# Patient Record
Sex: Male | Born: 1980 | Race: Black or African American | Hispanic: No | Marital: Single | Smoking: Current some day smoker
Health system: Southern US, Community
[De-identification: ages and names within clinical notes are randomized; demographics above are authoritative.]

## PROBLEM LIST (undated history)

## (undated) DIAGNOSIS — M109 Gout, unspecified: Secondary | ICD-10-CM

## (undated) HISTORY — PX: HERNIA REPAIR: SHX51

---
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
## 2001-04-05 ENCOUNTER — Encounter: Payer: Self-pay | Admitting: Emergency Medicine

## 2001-04-05 ENCOUNTER — Emergency Department (HOSPITAL_COMMUNITY): Admission: EM | Admit: 2001-04-05 | Discharge: 2001-04-05 | Payer: Self-pay | Admitting: Emergency Medicine

## 2001-10-22 ENCOUNTER — Emergency Department (HOSPITAL_COMMUNITY): Admission: EM | Admit: 2001-10-22 | Discharge: 2001-10-22 | Payer: Self-pay

## 2002-06-21 ENCOUNTER — Emergency Department (HOSPITAL_COMMUNITY): Admission: EM | Admit: 2002-06-21 | Discharge: 2002-06-21 | Payer: Self-pay | Admitting: Emergency Medicine

## 2004-12-25 ENCOUNTER — Emergency Department (HOSPITAL_COMMUNITY): Admission: EM | Admit: 2004-12-25 | Discharge: 2004-12-25 | Payer: Self-pay | Admitting: Emergency Medicine

## 2004-12-28 ENCOUNTER — Emergency Department (HOSPITAL_COMMUNITY): Admission: EM | Admit: 2004-12-28 | Discharge: 2004-12-28 | Payer: Self-pay | Admitting: Emergency Medicine

## 2009-07-21 ENCOUNTER — Emergency Department (HOSPITAL_COMMUNITY): Admission: EM | Admit: 2009-07-21 | Discharge: 2009-07-21 | Payer: Self-pay | Admitting: Emergency Medicine

## 2010-01-11 ENCOUNTER — Emergency Department (HOSPITAL_COMMUNITY): Admission: EM | Admit: 2010-01-11 | Discharge: 2010-01-11 | Payer: Self-pay | Admitting: Emergency Medicine

## 2010-03-13 ENCOUNTER — Emergency Department (HOSPITAL_COMMUNITY)
Admission: EM | Admit: 2010-03-13 | Discharge: 2010-03-14 | Payer: Self-pay | Source: Home / Self Care | Admitting: Emergency Medicine

## 2010-04-05 ENCOUNTER — Emergency Department (HOSPITAL_COMMUNITY)
Admission: EM | Admit: 2010-04-05 | Discharge: 2010-04-05 | Payer: Self-pay | Source: Home / Self Care | Admitting: Emergency Medicine

## 2011-10-27 ENCOUNTER — Encounter (HOSPITAL_COMMUNITY): Payer: Self-pay | Admitting: Emergency Medicine

## 2011-10-27 ENCOUNTER — Emergency Department (HOSPITAL_COMMUNITY)
Admission: EM | Admit: 2011-10-27 | Discharge: 2011-10-27 | Disposition: A | Discharge status: Home / Self Care | Payer: Self-pay | Attending: Emergency Medicine | Admitting: Emergency Medicine

## 2011-10-27 DIAGNOSIS — F17200 Nicotine dependence, unspecified, uncomplicated: Secondary | ICD-10-CM | POA: Insufficient documentation

## 2011-10-27 DIAGNOSIS — D367 Benign neoplasm of other specified sites: Secondary | ICD-10-CM

## 2011-10-27 DIAGNOSIS — D2360 Other benign neoplasm of skin of unspecified upper limb, including shoulder: Secondary | ICD-10-CM | POA: Insufficient documentation

## 2011-10-27 NOTE — ED Provider Notes (Signed)
History     CSN: [id number]  Arrival date & time 10/27/11  1032   First MD Initiated Contact with Patient 10/27/11 1059      Chief Complaint  Patient presents with  . Abscess    (Consider location/radiation/quality/duration/timing/severity/associated sxs/prior treatment) HPI Comments: Pt presents with "knot" under skin of right forearm. This has been present for 3 months, he states it started small and grew gradually, and it has remained the same size for the past month. Denies tenderness, redness, swelling, numbness or tingling, weakness, fever, chills.  States the area does not cause him any discomfort, his boss today asked him to have it checked.    Patient is a 31 y.o. male presenting with abscess. The history is provided by the patient.  Abscess  Pertinent negatives include no fever.    History reviewed. No pertinent past medical history.  History reviewed. No pertinent past surgical history.  No family history on file.  History  Substance Use Topics  . Smoking status: Current Some Day Smoker  . Smokeless tobacco: Not on file  . Alcohol Use: Yes     occassionally      Review of Systems  Constitutional: Negative for fever and chills.  Skin: Negative for color change and rash.  Neurological: Negative for weakness and numbness.    Allergies  Review of patient's allergies indicates no known allergies.  Home Medications  No current outpatient prescriptions on file.  BP 142/96  Pulse 73  Temp 98.5 F (36.9 C) (Oral)  Resp 20  SpO2 97%  Physical Exam  Nursing note and vitals reviewed. Constitutional: He appears well-developed and well-nourished. No distress.  HENT:  Head: Normocephalic and atraumatic.  Neck: Neck supple.  Pulmonary/Chest: Effort normal.  Musculoskeletal:       Right elbow: Normal.      Right wrist: Normal.       Right forearm: He exhibits no tenderness, no bony tenderness, no edema and no laceration.       Right arm, strength 5/5,  sensation intact, distal pulses intact, capillary refill< 2 seconds in all digits.    Neurological: He is alert.  Skin: He is not diaphoretic.          Mobile nodule under skin, overlying well-healed tattoo (reported 30 years old)    ED Course  Procedures (including critical care time)  Labs Reviewed - No data to display No results found.   1. Dermoid cyst of arm       MDM  Patient with what appears to be a simple cyst of right forearm.  No evidence of infection.  This has been present 3 months without causing any concerns or problems.  Pt d/c home with dermatology and PCP follow up.  Discussed likely diagnosis with patient.  Pt given return precautions.  Pt verbalizes understanding and agrees with plan.           [place], [place] 10/27/11 1214

## 2011-10-27 NOTE — ED Notes (Signed)
Pt states "I've been here for this before, it's just strange that it keeps coming back in the same place, they opened it once here before but I've opened it since then"

## 2011-10-27 NOTE — ED Notes (Signed)
Pt presenting to ed with c/o abscess to right forearm x 3 months pt states intermittent fever within the 3 months pt denies fever and chills at this time

## 2011-10-29 NOTE — ED Provider Notes (Signed)
Medical screening examination/treatment/procedure(s) were performed by non-physician practitioner and as supervising physician I was immediately available for consultation/collaboration.  Temitope Griffing, MD 10/29/11 1542 

## 2013-01-13 DIAGNOSIS — K859 Acute pancreatitis without necrosis or infection, unspecified: Secondary | ICD-10-CM | POA: Insufficient documentation

## 2013-01-13 DIAGNOSIS — F1010 Alcohol abuse, uncomplicated: Secondary | ICD-10-CM | POA: Insufficient documentation

## 2013-01-13 DIAGNOSIS — F17200 Nicotine dependence, unspecified, uncomplicated: Secondary | ICD-10-CM | POA: Insufficient documentation

## 2013-01-13 NOTE — ED Notes (Signed)
Pt states LUQ abdominal pain, N/v x 5, and back pain. Abdominal pain described as cramping. Back pain as spasms.

## 2013-01-14 ENCOUNTER — Emergency Department (HOSPITAL_COMMUNITY)
Admission: EM | Admit: 2013-01-14 | Discharge: 2013-01-14 | Disposition: A | Discharge status: Home / Self Care | Payer: Self-pay | Attending: Emergency Medicine | Admitting: Emergency Medicine

## 2013-01-14 ENCOUNTER — Encounter (HOSPITAL_COMMUNITY): Payer: Self-pay | Admitting: Emergency Medicine

## 2013-01-14 DIAGNOSIS — K852 Alcohol induced acute pancreatitis without necrosis or infection: Secondary | ICD-10-CM

## 2013-01-14 LAB — CBC WITH DIFFERENTIAL/PLATELET
Basophils Absolute: 0 10*3/uL (ref 0.0–0.1)
Basophils Relative: 0 % (ref 0–1)
Eosinophils Absolute: 0.1 10*3/uL (ref 0.0–0.7)
Eosinophils Relative: 1 % (ref 0–5)
HCT: 42.1 % (ref 39.0–52.0)
Hemoglobin: 14.7 g/dL (ref 13.0–17.0)
Lymphocytes Relative: 27 % (ref 12–46)
Lymphs Abs: 2.6 10*3/uL (ref 0.7–4.0)
MCH: 28.4 pg (ref 26.0–34.0)
MCHC: 34.9 g/dL (ref 30.0–36.0)
MCV: 81.3 fL (ref 78.0–100.0)
Monocytes Absolute: 0.6 10*3/uL (ref 0.1–1.0)
Monocytes Relative: 7 % (ref 3–12)
Neutro Abs: 6.4 10*3/uL (ref 1.7–7.7)
Neutrophils Relative %: 66 % (ref 43–77)
Platelets: 299 10*3/uL (ref 150–400)
RBC: 5.18 MIL/uL (ref 4.22–5.81)
RDW: 13.3 % (ref 11.5–15.5)
WBC: 9.6 10*3/uL (ref 4.0–10.5)

## 2013-01-14 LAB — COMPREHENSIVE METABOLIC PANEL
ALT: 24 U/L (ref 0–53)
AST: 23 U/L (ref 0–37)
Albumin: 4.3 g/dL (ref 3.5–5.2)
BUN: 11 mg/dL (ref 6–23)
CO2: 24 mEq/L (ref 19–32)
Calcium: 9.4 mg/dL (ref 8.4–10.5)
Chloride: 103 mEq/L (ref 96–112)
Creatinine, Ser: 1.26 mg/dL (ref 0.50–1.35)
GFR calc Af Amer: 87 mL/min — ABNORMAL LOW (ref >90)
GFR calc non Af Amer: 75 mL/min — ABNORMAL LOW (ref >90)
Glucose, Bld: 120 mg/dL — ABNORMAL HIGH (ref 70–99)
Total Bilirubin: 0.3 mg/dL (ref 0.3–1.2)
Total Protein: 7.9 g/dL (ref 6.0–8.3)

## 2013-01-14 MED ORDER — PANTOPRAZOLE SODIUM 40 MG IV SOLR
INTRAVENOUS | Status: AC
  Filled 2013-01-14: qty 40

## 2013-01-14 MED ORDER — METOCLOPRAMIDE HCL 10 MG PO TABS: 10.0000 mg | ORAL_TABLET | Freq: Four times a day (QID) | ORAL | Status: DC | PRN

## 2013-01-14 MED ORDER — KETOROLAC TROMETHAMINE 30 MG/ML IJ SOLN
INTRAMUSCULAR | Status: AC
  Filled 2013-01-14: qty 1

## 2013-01-14 MED ORDER — ONDANSETRON HCL 4 MG/2ML IJ SOLN
INTRAMUSCULAR | Status: AC
  Filled 2013-01-14: qty 2

## 2013-01-14 MED ORDER — OXYCODONE-ACETAMINOPHEN 5-325 MG PO TABS: 1.0000 | ORAL_TABLET | ORAL | Status: DC | PRN

## 2013-01-14 NOTE — ED Provider Notes (Signed)
CSN: [id number]     Arrival date & time 01/13/13  2355 History   First MD Initiated Contact with Patient 01/14/13 0134     Chief Complaint  Patient presents with  . Abdominal Pain   (Consider location/radiation/quality/duration/timing/severity/associated sxs/prior Treatment) Patient is a 32 y.o. male presenting with abdominal pain. The history is provided by the patient.  Abdominal Pain He complains of epigastric pain which started about 7:30 PM. Pain does radiate to the back. He describes it as sharp, crampy feeling. He rates pain at 10/10. Pain is worse if he lays supine or on either side. There has been associated nausea and vomiting he does live a better after vomiting. He tried taking Pepto-Bismol and ibuprofen with no relief. He did drink about 5 shots of liquor about 3 hours before onset of pain. He denies having had any episodes of pain like this in the past.  History reviewed. No pertinent past medical history. No past surgical history on file. No family history on file. History  Substance Use Topics  . Smoking status: Current Some Day Smoker  . Smokeless tobacco: Not on file  . Alcohol Use: Yes     Comment: occassionally    Review of Systems  Gastrointestinal: Positive for abdominal pain.  All other systems reviewed and are negative.    Allergies  Review of patient's allergies indicates no known allergies.  Home Medications   Current Outpatient Rx  Name  Route  Sig  Dispense  Refill  . metoCLOPramide (REGLAN) 10 MG tablet   Oral   Take 1 tablet (10 mg total) by mouth every 6 (six) hours as needed (Nausea).   30 tablet   0   . oxyCODONE-acetaminophen (PERCOCET) 5-325 MG per tablet   Oral   Take 1 tablet by mouth every 4 (four) hours as needed for pain.   20 tablet   0    BP 159/107  Temp(Src) 98.4 F (36.9 C) (Oral)  Resp 18  SpO2 97% Physical Exam  Nursing note and vitals reviewed.  32 year old male, resting comfortably and in no acute distress.  Vital signs are significant for hypertension with blood pressure 159/107. Oxygen saturation is 97%, which is normal. Head is normocephalic and atraumatic. PERRLA, EOMI. Oropharynx is clear. Neck is nontender and supple without adenopathy or JVD. Back is nontender and there is no CVA tenderness. Lungs are clear without rales, wheezes, or rhonchi. Chest is nontender. Heart has regular rate and rhythm without murmur. Abdomen is soft, flat, with mild epigastric tenderness. There is no rebound or guarding. There are no masses or hepatosplenomegaly and peristalsis is hypoactive. Extremities have no cyanosis or edema, full range of motion is present. Skin is warm and dry without rash. Neurologic: Mental status is normal, cranial nerves are intact, there are no motor or sensory deficits.  ED Course  Procedures (including critical care time) Labs Review Results for orders placed during the hospital encounter of 01/14/13  CBC WITH DIFFERENTIAL      Result Value Range   WBC 9.6  4.0 - 10.5 K/uL   RBC 5.18  4.22 - 5.81 MIL/uL   Hemoglobin 14.7  13.0 - 17.0 g/dL   HCT 40.9  81.1 - 91.4 %   MCV 81.3  78.0 - 100.0 fL   MCH 28.4  26.0 - 34.0 pg   MCHC 34.9  30.0 - 36.0 g/dL   RDW 78.2  95.6 - 21.3 %   Platelets 299  150 - 400 K/uL  Neutrophils Relative % 66  43 - 77 %   Neutro Abs 6.4  1.7 - 7.7 K/uL   Lymphocytes Relative 27  12 - 46 %   Lymphs Abs 2.6  0.7 - 4.0 K/uL   Monocytes Relative 7  3 - 12 %   Monocytes Absolute 0.6  0.1 - 1.0 K/uL   Eosinophils Relative 1  0 - 5 %   Eosinophils Absolute 0.1  0.0 - 0.7 K/uL   Basophils Relative 0  0 - 1 %   Basophils Absolute 0.0  0.0 - 0.1 K/uL  COMPREHENSIVE METABOLIC PANEL      Result Value Range   Sodium 141  135 - 145 mEq/L   Potassium 3.5  3.5 - 5.1 mEq/L   Chloride 103  96 - 112 mEq/L   CO2 24  19 - 32 mEq/L   Glucose, Bld 120 (*) 70 - 99 mg/dL   BUN 11  6 - 23 mg/dL   Creatinine, Ser 1.61  0.50 - 1.35 mg/dL   Calcium 9.4  8.4 - 09.6  mg/dL   Total Protein 7.9  6.0 - 8.3 g/dL   Albumin 4.3  3.5 - 5.2 g/dL   AST 23  0 - 37 U/L   ALT 24  0 - 53 U/L   Alkaline Phosphatase 57  39 - 117 U/L   Total Bilirubin 0.3  0.3 - 1.2 mg/dL   GFR calc non Af Amer 75 (*) >90 mL/min   GFR calc Af Amer 87 (*) >90 mL/min  LIPASE, BLOOD      Result Value Range   Lipase 83 (*) 11 - 59 U/L   MDM   1. Acute alcoholic pancreatitis    Abdominal pain which seems most consistent with gastritis, possibly alcoholic gastritis. He'll be given IV fluids, IV pantoprazole, and IV ondansetron and reassessed.  He feels much better after above noted treatment. Lipase is come back significantly elevated and patient when confronted with this does admit to significantly more ethanol consumption than for what he stated initially. He is advised to abstain from alcohol. Since his symptoms are controlled well, he is felt stable for discharge and he is discharged with prescriptions for metoclopramide and oxycodone-acetaminophen and he is told to take over-the-counter omeprazole.    Dione Booze, MD 01/14/13 [phone]

## 2013-01-14 NOTE — ED Notes (Signed)
MD at Bedside.

## 2013-01-14 NOTE — ED Notes (Signed)
For previous charting see downtime forms

## 2013-10-16 ENCOUNTER — Emergency Department (HOSPITAL_COMMUNITY): Payer: Self-pay

## 2013-10-16 ENCOUNTER — Emergency Department (HOSPITAL_COMMUNITY)
Admission: EM | Admit: 2013-10-16 | Discharge: 2013-10-16 | Disposition: A | Discharge status: Home / Self Care | Payer: Self-pay | Attending: Emergency Medicine | Admitting: Emergency Medicine

## 2013-10-16 ENCOUNTER — Encounter (HOSPITAL_COMMUNITY): Payer: Self-pay | Admitting: Emergency Medicine

## 2013-10-16 DIAGNOSIS — W261XXA Contact with sword or dagger, initial encounter: Secondary | ICD-10-CM

## 2013-10-16 DIAGNOSIS — Z792 Long term (current) use of antibiotics: Secondary | ICD-10-CM | POA: Insufficient documentation

## 2013-10-16 DIAGNOSIS — Z23 Encounter for immunization: Secondary | ICD-10-CM | POA: Insufficient documentation

## 2013-10-16 DIAGNOSIS — W260XXA Contact with knife, initial encounter: Secondary | ICD-10-CM | POA: Insufficient documentation

## 2013-10-16 DIAGNOSIS — Y9389 Activity, other specified: Secondary | ICD-10-CM | POA: Insufficient documentation

## 2013-10-16 DIAGNOSIS — F17200 Nicotine dependence, unspecified, uncomplicated: Secondary | ICD-10-CM | POA: Insufficient documentation

## 2013-10-16 DIAGNOSIS — S71009A Unspecified open wound, unspecified hip, initial encounter: Secondary | ICD-10-CM | POA: Insufficient documentation

## 2013-10-16 DIAGNOSIS — S71111A Laceration without foreign body, right thigh, initial encounter: Secondary | ICD-10-CM

## 2013-10-16 DIAGNOSIS — Z7982 Long term (current) use of aspirin: Secondary | ICD-10-CM | POA: Insufficient documentation

## 2013-10-16 DIAGNOSIS — S71109A Unspecified open wound, unspecified thigh, initial encounter: Principal | ICD-10-CM | POA: Insufficient documentation

## 2013-10-16 DIAGNOSIS — Y929 Unspecified place or not applicable: Secondary | ICD-10-CM | POA: Insufficient documentation

## 2013-10-16 LAB — BASIC METABOLIC PANEL
Anion gap: 13 (ref 5–15)
BUN: 11 mg/dL (ref 6–23)
CHLORIDE: 102 meq/L (ref 96–112)
CO2: 26 mEq/L (ref 19–32)
CREATININE: 1.19 mg/dL (ref 0.50–1.35)
Calcium: 8.7 mg/dL (ref 8.4–10.5)
GFR calc non Af Amer: 80 mL/min — ABNORMAL LOW (ref >90)
GLUCOSE: 92 mg/dL (ref 70–99)
Potassium: 3.6 mEq/L — ABNORMAL LOW (ref 3.7–5.3)
Sodium: 141 mEq/L (ref 137–147)

## 2013-10-16 LAB — CBC WITH DIFFERENTIAL/PLATELET
Basophils Absolute: 0 10*3/uL (ref 0.0–0.1)
Basophils Relative: 1 % (ref 0–1)
Eosinophils Absolute: 0.2 10*3/uL (ref 0.0–0.7)
Eosinophils Relative: 3 % (ref 0–5)
HCT: 40.3 % (ref 39.0–52.0)
HEMOGLOBIN: 12.9 g/dL — AB (ref 13.0–17.0)
LYMPHS ABS: 2.5 10*3/uL (ref 0.7–4.0)
Lymphocytes Relative: 38 % (ref 12–46)
MCH: 26.6 pg (ref 26.0–34.0)
MCHC: 32 g/dL (ref 30.0–36.0)
MCV: 83.1 fL (ref 78.0–100.0)
MONOS PCT: 9 % (ref 3–12)
Monocytes Absolute: 0.6 10*3/uL (ref 0.1–1.0)
NEUTROS ABS: 3.2 10*3/uL (ref 1.7–7.7)
Neutrophils Relative %: 49 % (ref 43–77)
Platelets: 311 10*3/uL (ref 150–400)
RBC: 4.85 MIL/uL (ref 4.22–5.81)
RDW: 12.6 % (ref 11.5–15.5)
WBC: 6.5 10*3/uL (ref 4.0–10.5)

## 2013-10-16 LAB — PROTIME-INR
INR: 1.04 (ref 0.00–1.49)
PROTHROMBIN TIME: 13.6 s (ref 11.6–15.2)

## 2013-10-16 LAB — APTT: aPTT: 29 seconds (ref 24–37)

## 2013-10-16 MED ORDER — FENTANYL CITRATE 0.05 MG/ML IJ SOLN
INTRAMUSCULAR | Status: AC
  Filled 2013-10-16: qty 2

## 2013-10-16 MED ORDER — TETANUS-DIPHTH-ACELL PERTUSSIS 5-2.5-18.5 LF-MCG/0.5 IM SUSP
0.5000 mL | Freq: Once | INTRAMUSCULAR | Status: AC
  Administered 2013-10-16: 0.5 mL via INTRAMUSCULAR
  Filled 2013-10-16: qty 0.5

## 2013-10-16 MED ORDER — HYDROCODONE-ACETAMINOPHEN 5-325 MG PO TABS: 2.0000 | ORAL_TABLET | ORAL | Status: DC | PRN

## 2013-10-16 MED ORDER — SULFAMETHOXAZOLE-TRIMETHOPRIM 800-160 MG PO TABS: 1.0000 | ORAL_TABLET | Freq: Two times a day (BID) | ORAL | Status: DC

## 2013-10-16 MED ORDER — FENTANYL CITRATE 0.05 MG/ML IJ SOLN
50.0000 ug | Freq: Once | INTRAMUSCULAR | Status: AC
  Administered 2013-10-16: 50 ug via INTRAVENOUS

## 2013-10-16 MED ORDER — CEPHALEXIN 500 MG PO CAPS: 500.0000 mg | ORAL_CAPSULE | Freq: Four times a day (QID) | ORAL | Status: DC

## 2013-10-16 NOTE — ED Notes (Signed)
Pillow placed under right knee for comfort.
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
## 2013-10-16 NOTE — ED Notes (Signed)
Pt wc to car via security. Pt alert x4 dressing applied with ace wrap

## 2013-10-16 NOTE — ED Notes (Signed)
Ambulate pt with one assist. Pt denies pain with weight bearing but states he is unable to bend leg. Pt steps and drags leg.

## 2013-10-16 NOTE — Progress Notes (Signed)
Orthopedic Tech Progress Note Patient Details:  Derrick Shaffer 28-Jul-1980 [id number]  Ortho Devices Type of Ortho Device: Knee Immobilizer;Crutches Ortho Device/Splint Location: RLE Ortho Device/Splint Interventions: Ordered;Application;Adjustment   Braulio Bosch 10/16/2013, 8:26 PM

## 2013-10-16 NOTE — ED Notes (Signed)
Portable xray being completed.  

## 2013-10-16 NOTE — Discharge Instructions (Signed)
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
## 2013-12-07 ENCOUNTER — Encounter (HOSPITAL_COMMUNITY): Payer: Self-pay | Admitting: Emergency Medicine

## 2013-12-07 ENCOUNTER — Emergency Department (HOSPITAL_COMMUNITY)
Admission: EM | Admit: 2013-12-07 | Discharge: 2013-12-07 | Disposition: A | Discharge status: Home / Self Care | Payer: Worker's Compensation | Attending: Emergency Medicine | Admitting: Emergency Medicine

## 2013-12-07 DIAGNOSIS — Y9389 Activity, other specified: Secondary | ICD-10-CM | POA: Insufficient documentation

## 2013-12-07 DIAGNOSIS — Z792 Long term (current) use of antibiotics: Secondary | ICD-10-CM | POA: Insufficient documentation

## 2013-12-07 DIAGNOSIS — Y990 Civilian activity done for income or pay: Secondary | ICD-10-CM | POA: Insufficient documentation

## 2013-12-07 DIAGNOSIS — S39012A Strain of muscle, fascia and tendon of lower back, initial encounter: Secondary | ICD-10-CM

## 2013-12-07 DIAGNOSIS — X503XXA Overexertion from repetitive movements, initial encounter: Secondary | ICD-10-CM | POA: Insufficient documentation

## 2013-12-07 DIAGNOSIS — IMO0002 Reserved for concepts with insufficient information to code with codable children: Secondary | ICD-10-CM | POA: Insufficient documentation

## 2013-12-07 DIAGNOSIS — S335XXA Sprain of ligaments of lumbar spine, initial encounter: Secondary | ICD-10-CM | POA: Insufficient documentation

## 2013-12-07 DIAGNOSIS — F17200 Nicotine dependence, unspecified, uncomplicated: Secondary | ICD-10-CM | POA: Insufficient documentation

## 2013-12-07 DIAGNOSIS — X500XXA Overexertion from strenuous movement or load, initial encounter: Secondary | ICD-10-CM | POA: Insufficient documentation

## 2013-12-07 DIAGNOSIS — Y9289 Other specified places as the place of occurrence of the external cause: Secondary | ICD-10-CM | POA: Insufficient documentation

## 2013-12-07 MED ORDER — CYCLOBENZAPRINE HCL 10 MG PO TABS: 10.0000 mg | ORAL_TABLET | Freq: Two times a day (BID) | ORAL | Status: DC | PRN

## 2013-12-07 MED ORDER — MELOXICAM 15 MG PO TABS: 15.0000 mg | ORAL_TABLET | Freq: Every day | ORAL | Status: DC

## 2013-12-07 NOTE — ED Notes (Signed)
Pt reports that he was at work and was using a Therapist, art to life a heavy object and states he was using force to use the tool and states that he developed lower back pain. Pt reports having back pain 7-10 years ago, however states the pain then was worse than it is today. Pt is ambulatory to triage unassisted with a steady gait, denies incontinence, pt is A/O x4, in NAD, and vitals are WDL.

## 2013-12-07 NOTE — ED Provider Notes (Signed)
CSN: [id number]     Arrival date & time 12/07/13  1842 History  This chart was scribed for non-physician practitioner, Alvina Chou, PA-C,working with Orlie Dakin, MD, by Jeanell Sparrow, ED Scribe. This patient was seen in room WTR8/WTR8 and the patient's care was started at 7:30 PM.   Chief Complaint  Patient presents with  . Back Pain   The history is provided by the patient. No language interpreter was used.   HPI Comments: Derrick Shaffer is a 33 y.o. male who presents to the Emergency Department complaining of constant moderate left sided lower back pain that started today . He states that he was at work when he was using a Therapist, art to lift a heavy object when the pain started. He denies any fall. He describes the sensation as a muscle spasm. He reports that he took ibuprofen without any relief. He denies any associated numbness or tingling, or abdominal pain.   History reviewed. No pertinent past medical history. History reviewed. No pertinent past surgical history. No family history on file. History  Substance Use Topics  . Smoking status: Current Some Day Smoker  . Smokeless tobacco: Not on file  . Alcohol Use: Yes     Comment: occassionally    Review of Systems  Musculoskeletal: Positive for back pain.  All other systems reviewed and are negative.   Allergies  Review of patient's allergies indicates no known allergies.  Home Medications   Prior to Admission medications   Medication Sig Start Date End Date Taking? Authorizing Provider  Aspirin-Salicylamide-Caffeine (BC HEADACHE POWDER PO) Take 2 packets by mouth daily as needed (headaches).    Historical Provider, MD  cephALEXin (KEFLEX) 500 MG capsule Take 1 capsule (500 mg total) by mouth 4 (four) times daily. 10/16/13   Deno Etienne, MD  HYDROcodone-acetaminophen (NORCO/VICODIN) 5-325 MG per tablet Take 2 tablets by mouth every 4 (four) hours as needed for moderate pain or severe pain. 10/16/13   Deno Etienne, MD   sulfamethoxazole-trimethoprim (SEPTRA DS) 800-160 MG per tablet Take 1 tablet by mouth every 12 (twelve) hours. 10/16/13   Deno Etienne, MD   BP 121/64  Pulse 55  Temp(Src) 98.2 F (36.8 C) (Oral)  Resp 18  SpO2 99% Physical Exam  Nursing note and vitals reviewed. Constitutional: He is oriented to person, place, and time. He appears well-developed and well-nourished.  HENT:  Head: Normocephalic and atraumatic.  Neck: Neck supple. No tracheal deviation present.  Cardiovascular: Normal rate.   Pulmonary/Chest: Effort normal. No respiratory distress.  Abdominal: There is no tenderness.  Musculoskeletal: Normal range of motion.  No midline spine TTP. Left lumbar paraspinal TTP.   Neurological: He is alert and oriented to person, place, and time.  Strength and sensation in extremities intact.   Skin: Skin is warm and dry.  Psychiatric: He has a normal mood and affect. His behavior is normal.    ED Course  Procedures (including critical care time) DIAGNOSTIC STUDIES: Oxygen Saturation is 99% on RA, normal by my interpretation.    COORDINATION OF CARE: 7:34 PM- Pt advised of plan for treatment and pt agrees.  Labs Review Labs Reviewed - No data to display  Imaging Review No results found.   EKG Interpretation None      MDM   Final diagnoses:  Lumbar strain, initial encounter    8:04 PM Patient likely has muscle spasm of left lumbar paraspinal muscles. No blunt trauma or bony tenderness to indicate imaging at this time. No bladder/bowel  incontinence or saddle paresthesias. Patient will have mobic and flexeril for pain. No further evaluation needed at this time.   I personally performed the services described in this documentation, which was scribed in my presence. The recorded information has been reviewed and is accurate.     Alvina Chou, PA-C 12/07/13 2007

## 2013-12-07 NOTE — Discharge Instructions (Signed)
Take Mobic as needed for pain. Take Flexeril as needed for muscle spasm. You may take these medications together. Refer to attached documents for more information.

## 2013-12-08 NOTE — ED Provider Notes (Signed)
Medical screening examination/treatment/procedure(s) were performed by non-physician practitioner and as supervising physician I was immediately available for consultation/collaboration.   EKG Interpretation None       Orlie Dakin, MD 12/08/13 0174

## 2014-09-19 ENCOUNTER — Emergency Department (HOSPITAL_BASED_OUTPATIENT_CLINIC_OR_DEPARTMENT_OTHER)
Admission: EM | Admit: 2014-09-19 | Discharge: 2014-09-19 | Disposition: A | Discharge status: Home / Self Care | Payer: Self-pay | Attending: Emergency Medicine | Admitting: Emergency Medicine

## 2014-09-19 ENCOUNTER — Encounter (HOSPITAL_BASED_OUTPATIENT_CLINIC_OR_DEPARTMENT_OTHER): Payer: Self-pay

## 2014-09-19 DIAGNOSIS — Z720 Tobacco use: Secondary | ICD-10-CM | POA: Insufficient documentation

## 2014-09-19 DIAGNOSIS — Z202 Contact with and (suspected) exposure to infections with a predominantly sexual mode of transmission: Secondary | ICD-10-CM | POA: Insufficient documentation

## 2014-09-19 DIAGNOSIS — Z791 Long term (current) use of non-steroidal anti-inflammatories (NSAID): Secondary | ICD-10-CM | POA: Insufficient documentation

## 2014-09-19 LAB — URINALYSIS, ROUTINE W REFLEX MICROSCOPIC
BILIRUBIN URINE: NEGATIVE
Glucose, UA: NEGATIVE mg/dL
Hgb urine dipstick: NEGATIVE
KETONES UR: NEGATIVE mg/dL
Leukocytes, UA: NEGATIVE
Nitrite: NEGATIVE
PROTEIN: NEGATIVE mg/dL
Specific Gravity, Urine: 1.024 (ref 1.005–1.030)
Urobilinogen, UA: 1 mg/dL (ref 0.0–1.0)
pH: 7 (ref 5.0–8.0)

## 2014-09-19 MED ORDER — AZITHROMYCIN 250 MG PO TABS
1000.0000 mg | ORAL_TABLET | Freq: Once | ORAL | Status: AC
  Administered 2014-09-19: 1000 mg via ORAL
  Filled 2014-09-19: qty 4

## 2014-09-19 MED ORDER — CEFTRIAXONE SODIUM 250 MG IJ SOLR
250.0000 mg | Freq: Once | INTRAMUSCULAR | Status: AC
  Administered 2014-09-19: 250 mg via INTRAMUSCULAR
  Filled 2014-09-19: qty 250

## 2014-09-19 NOTE — ED Notes (Signed)
Pt reports was told by sex partner that she had gonorrhea.  States he is asymptomatic.  Reports also with ha but unsure if this is stress r/t symptoms.

## 2014-09-19 NOTE — Discharge Instructions (Signed)
Please read and follow all provided instructions.  Your diagnoses today include:  1. Exposure to STD     Tests performed today include:  Test for gonorrhea, chlamydia, HIV, and syphilis. You will be notified by telephone if you have a positive result.  Vital signs. See below for your results today.   Medications:  You were treated for chlamydia (1 gram azithromycin pills) and gonorrhea (250mg  rocephin shot).  Home care instructions:  Read educational materials contained in this packet and follow any instructions provided.   You should tell your partners about your infection and avoid having sex for one week to allow time for the medicine to work.  STD Testing:  [place]  [address], [place], phone [phone] or [phone]    Monday - Friday, call for an appointment  [place]  [address], [place], phone [phone] or [phone]   Monday - Friday, call for an appointment  Return instructions:   Please return to the Emergency Department if you experience worsening symptoms.   Please return if you have any other emergent concerns.  Additional Information:  Your vital signs today were: BP 115/68 mmHg   Pulse 63   Temp(Src) 98.2 F (36.8 C) (Oral)   Resp 18   Ht 6' (1.829 m)   Wt 230 lb (104.327 kg)   BMI 31.19 kg/m2   SpO2 99% If your blood pressure (BP) was elevated above 135/85 this visit, please have this repeated by your doctor within one month. --------------

## 2014-09-19 NOTE — ED Provider Notes (Signed)
CSN: [id number]     Arrival date & time 09/19/14  1926 History   First MD Initiated Contact with Patient 09/19/14 2038     Chief Complaint  Patient presents with  . Exposure to STD     (Consider location/radiation/quality/duration/timing/severity/associated sxs/prior Treatment) HPI Comments: Patient presents with complaint of exposure to gonorrhea. Patient states a sexual partners told him that she tested positive for this. Patient has no symptoms including dysuria, hematuria, penile discharge. He denies rashes or sores. No sore throat. No fever, nausea or vomiting.   Patient is a 34 y.o. male presenting with STD exposure. The history is provided by the patient.  Exposure to STD Pertinent negatives include no arthralgias, fever, rash or sore throat.    History reviewed. No pertinent past medical history. History reviewed. No pertinent past surgical history. No family history on file. History  Substance Use Topics  . Smoking status: Current Some Day Smoker  . Smokeless tobacco: Not on file  . Alcohol Use: Yes     Comment: occassionally    Review of Systems  Constitutional: Negative for fever.  HENT: Negative for sore throat.   Eyes: Negative for discharge.  Gastrointestinal: Negative for rectal pain.  Genitourinary: Negative for dysuria, frequency, discharge, genital sores, penile pain and testicular pain.  Musculoskeletal: Negative for arthralgias.  Skin: Negative for rash.  Hematological: Negative for adenopathy.      Allergies  Review of patient's allergies indicates no known allergies.  Home Medications   Prior to Admission medications   Medication Sig Start Date End Date Taking? Authorizing Provider  Aspirin-Salicylamide-Caffeine (BC HEADACHE POWDER PO) Take 2 packets by mouth daily as needed (headaches).    Historical Provider, MD  cyclobenzaprine (FLEXERIL) 10 MG tablet Take 1 tablet (10 mg total) by mouth 2 (two) times daily as needed for muscle spasms. 12/07/13    Kaitlyn Szekalski, PA-C  ibuprofen (ADVIL,MOTRIN) 200 MG tablet Take 800 mg by mouth every 6 (six) hours as needed for mild pain or moderate pain (back pain).    Historical Provider, MD  meloxicam (MOBIC) 15 MG tablet Take 1 tablet (15 mg total) by mouth daily. 12/07/13   Kaitlyn Szekalski, PA-C   BP 115/68 mmHg  Pulse 63  Temp(Src) 98.2 F (36.8 C) (Oral)  Resp 18  Ht 6' (1.829 m)  Wt 230 lb (104.327 kg)  BMI 31.19 kg/m2  SpO2 99%   Physical Exam  Constitutional: He appears well-developed and well-nourished.  HENT:  Head: Normocephalic and atraumatic.  Eyes: Conjunctivae are normal.  Neck: Normal range of motion. Neck supple.  Pulmonary/Chest: No respiratory distress.  Genitourinary: Penis normal. Right testis shows no mass and no tenderness. Left testis shows no mass and no tenderness. Circumcised. No discharge found.  Lymphadenopathy:       Right: No inguinal adenopathy present.       Left: No inguinal adenopathy present.  Neurological: He is alert.  Skin: Skin is warm and dry.  Psychiatric: He has a normal mood and affect.  Nursing note and vitals reviewed.   ED Course  Procedures (including critical care time) Labs Review Labs Reviewed  URINALYSIS, ROUTINE W REFLEX MICROSCOPIC (NOT AT [facility])  HIV ANTIBODY (ROUTINE TESTING)  RPR  GC/CHLAMYDIA PROBE AMP ([place]) NOT AT [facility]    Imaging Review No results found.   EKG Interpretation None       8:55 PM Patient seen and examined. Patient agrees to HIV/RPR. Will treat for gonorrhea and chlamydia.  Vital signs reviewed and  are as follows: BP 115/68 mmHg  Pulse 63  Temp(Src) 98.2 F (36.8 C) (Oral)  Resp 18  Ht 6' (1.829 m)  Wt 230 lb (104.327 kg)  BMI 31.19 kg/m2  SpO2 99%  Patient counseled on safe sexual practices. Told them that they should not have sexual contact for next 7 days and that they need to inform sexual partners so that they can get tested and treated as well. Patient verbalizes  understanding and agrees with plan.     MDM   Final diagnoses:  Exposure to STD   Patient with exposure to gonorrhea. He is asymptomatic. Tested and treated as above.    Carlisle Cater, PA-C 09/19/14 2120  Sherwood Gambler, MD 09/22/14 [phone]

## 2014-09-20 LAB — GC/CHLAMYDIA PROBE AMP (~~LOC~~) NOT AT ARMC
CHLAMYDIA, DNA PROBE: NEGATIVE
NEISSERIA GONORRHEA: NEGATIVE

## 2014-09-21 LAB — HIV ANTIBODY (ROUTINE TESTING W REFLEX): HIV Screen 4th Generation wRfx: NONREACTIVE

## 2014-09-21 LAB — RPR: RPR Ser Ql: NONREACTIVE

## 2014-09-24 ENCOUNTER — Telehealth (HOSPITAL_COMMUNITY): Payer: Self-pay

## 2014-10-15 ENCOUNTER — Telehealth (HOSPITAL_COMMUNITY): Payer: Self-pay

## 2015-10-14 ENCOUNTER — Emergency Department (HOSPITAL_COMMUNITY)
Admission: EM | Admit: 2015-10-14 | Discharge: 2015-10-14 | Disposition: A | Discharge status: Home / Self Care | Payer: Self-pay | Attending: Emergency Medicine | Admitting: Emergency Medicine

## 2015-10-14 ENCOUNTER — Encounter (HOSPITAL_COMMUNITY): Payer: Self-pay | Admitting: Emergency Medicine

## 2015-10-14 ENCOUNTER — Emergency Department (HOSPITAL_COMMUNITY): Payer: Self-pay

## 2015-10-14 DIAGNOSIS — M79671 Pain in right foot: Secondary | ICD-10-CM | POA: Insufficient documentation

## 2015-10-14 DIAGNOSIS — F17200 Nicotine dependence, unspecified, uncomplicated: Secondary | ICD-10-CM | POA: Insufficient documentation

## 2015-10-14 DIAGNOSIS — Z7982 Long term (current) use of aspirin: Secondary | ICD-10-CM | POA: Insufficient documentation

## 2015-10-14 MED ORDER — IBUPROFEN 800 MG PO TABS
800.0000 mg | ORAL_TABLET | Freq: Once | ORAL | Status: AC
  Administered 2015-10-14: 800 mg via ORAL
  Filled 2015-10-14: qty 1

## 2015-10-14 MED ORDER — IBUPROFEN 800 MG PO TABS: 800.0000 mg | ORAL_TABLET | Freq: Three times a day (TID) | ORAL | 0 refills | Status: DC

## 2015-10-14 NOTE — Discharge Instructions (Signed)
Your xrays are normal.  Take ibuprofen three times daily as needed for pain.  Follow up with orthopedics for persistent symptoms for further evaluation.

## 2015-10-14 NOTE — ED Triage Notes (Signed)
Patient c/o right foot pain that started during the night. patient denies any injury that could have caused the pain.  Patient took Ibuprofen 600mg  for the pain then went to sleep but after he woke up and walking on it patient has pain again.

## 2015-10-14 NOTE — ED Provider Notes (Signed)
[place] DEPT Provider Note   CSN: [id number] Arrival date & time: 10/14/15  1450  First Provider Contact:  None    By signing my name below, I, Higinio Plan, attest that this documentation has been prepared under the direction and in the presence of non-physician practitioner, Gloriann Loan, PA-C. Electronically Signed: Higinio Plan, Scribe. 10/14/2015. 4:27 PM.  History   Chief Complaint Chief Complaint  Patient presents with  . Foot Pain   The history is provided by the patient. No language interpreter was used.   HPI Comments: Derrick Shaffer is a 35 y.o. male who presents to the Emergency Department complaining of sudden onset, gradually worsening, "sharp," right foot pain that began last night and worsened this morning. He notes his pain is encompassed around his entire foot as well on the sole and ball of his right foot. He states walking and movement increases his pain. Pt denies any recent injury that could have caused his pain. He denies numbness of his lower extremities. Pt suspects he has gout after talking to his father and brother that have a hx of gout. He notes he took 600 mg ibuprofen at 3:00 AM this morning with mild relief.   History reviewed. No pertinent past medical history.  There are no active problems to display for this patient.  History reviewed. No pertinent surgical history.  Home Medications    Prior to Admission medications   Medication Sig Start Date End Date Taking? Authorizing Provider  Aspirin-Salicylamide-Caffeine (BC HEADACHE POWDER PO) Take 2 packets by mouth daily as needed (headaches).    Historical Provider, MD  cyclobenzaprine (FLEXERIL) 10 MG tablet Take 1 tablet (10 mg total) by mouth 2 (two) times daily as needed for muscle spasms. 12/07/13   Kaitlyn Szekalski, PA-C  ibuprofen (ADVIL,MOTRIN) 800 MG tablet Take 1 tablet (800 mg total) by mouth 3 (three) times daily. 10/14/15   Gloriann Loan, PA-C  meloxicam (MOBIC) 15 MG tablet Take 1 tablet (15  mg total) by mouth daily. 12/07/13   Alvina Chou, PA-C   Family History No family history on file.  Social History Social History  Substance Use Topics  . Smoking status: Current Some Day Smoker  . Smokeless tobacco: Never Used  . Alcohol use Yes     Comment: occassionally   Allergies   Review of patient's allergies indicates no known allergies.  Review of Systems Review of Systems  Musculoskeletal: Positive for arthralgias (right foot).  Neurological: Negative for numbness.  All other systems reviewed and are negative.  Physical Exam Updated Vital Signs BP 147/94 (BP Location: Left Arm)   Pulse 80   Temp 98.1 F (36.7 C) (Oral)   Resp 16   SpO2 96%   Physical Exam  Constitutional: He is oriented to person, place, and time. He appears well-developed and well-nourished.  HENT:  Head: Normocephalic and atraumatic.  Right Ear: External ear normal.  Left Ear: External ear normal.  Eyes: Conjunctivae are normal. No scleral icterus.  Neck: No tracheal deviation present.  Cardiovascular:  Pulses:      Dorsalis pedis pulses are 2+ on the right side, and 2+ on the left side.  Pulmonary/Chest: Effort normal. No respiratory distress.  Abdominal: He exhibits no distension.  Musculoskeletal: He exhibits tenderness. He exhibits no edema.       Right ankle: Normal.       Right foot: There is decreased range of motion (secondary to pain), tenderness and bony tenderness. There is no swelling, normal capillary refill  and no crepitus.       Feet:  Neurological: He is alert and oriented to person, place, and time.  Normal strength and sensation.   Skin: Skin is warm and dry.  No erythema, warmth, induration, or fluctuance.   Psychiatric: He has a normal mood and affect. His behavior is normal.   ED Treatments / Results  Labs (all labs ordered are listed, but only abnormal results are displayed) Labs Reviewed - No data to display  EKG  EKG Interpretation None        Radiology Dg Foot Complete Right  Result Date: 10/14/2015 CLINICAL DATA:  Sudden onset sharp right foot pain beginning last night as pain is generalized. EXAM: RIGHT FOOT COMPLETE - 3+ VIEW COMPARISON:  None. FINDINGS: There are mild degenerative changes over the midfoot. There is no evidence of acute fracture or dislocation. IMPRESSION: No acute findings. Electronically Signed   By: Marin Olp M.D.   On: 10/14/2015 17:03      Procedures Procedures  DIAGNOSTIC STUDIES:  Oxygen Saturation is 96% on RA, normal by my interpretation.    COORDINATION OF CARE:  4:23 PM Discussed treatment plan, which includes X-ray of right foot with pt at bedside and pt agreed to plan.  Medications Ordered in ED Medications  ibuprofen (ADVIL,MOTRIN) tablet 800 mg (800 mg Oral Given 10/14/15 1645)     Initial Impression / Assessment and Plan / ED Course  I have reviewed the triage vital signs and the nursing notes.  Pertinent labs & imaging results that were available during my care of the patient were reviewed by me and considered in my medical decision making (see chart for details).  Clinical Course   Patient presents with atraumatic right foot pain.  Neurovascularly intact.  No signs of infection.  Plain films normal.  Ibuprofen in ED and discharge script.  Follow up orthopedics.  Return precautions discussed.  Patient agrees and acknowledges the above plan for discharge.   Final Clinical Impressions(s) / ED Diagnoses   Final diagnoses:  Foot pain, right    New Prescriptions New Prescriptions   IBUPROFEN (ADVIL,MOTRIN) 800 MG TABLET    Take 1 tablet (800 mg total) by mouth 3 (three) times daily.   I personally performed the services described in this documentation, which was scribed in my presence. The recorded information has been reviewed and is accurate.     Gloriann Loan, PA-C 10/14/15 1715    Malvin Johns, MD 10/14/15 [phone]

## 2016-03-01 ENCOUNTER — Encounter (HOSPITAL_COMMUNITY): Payer: Self-pay | Admitting: Emergency Medicine

## 2016-03-01 ENCOUNTER — Emergency Department (HOSPITAL_COMMUNITY)
Admission: EM | Admit: 2016-03-01 | Discharge: 2016-03-01 | Disposition: A | Discharge status: Home / Self Care | Payer: Self-pay | Attending: Emergency Medicine | Admitting: Emergency Medicine

## 2016-03-01 ENCOUNTER — Emergency Department (HOSPITAL_COMMUNITY): Payer: Self-pay

## 2016-03-01 DIAGNOSIS — F17200 Nicotine dependence, unspecified, uncomplicated: Secondary | ICD-10-CM | POA: Insufficient documentation

## 2016-03-01 DIAGNOSIS — G8929 Other chronic pain: Secondary | ICD-10-CM | POA: Insufficient documentation

## 2016-03-01 DIAGNOSIS — M25562 Pain in left knee: Secondary | ICD-10-CM | POA: Insufficient documentation

## 2016-03-01 DIAGNOSIS — Z7982 Long term (current) use of aspirin: Secondary | ICD-10-CM | POA: Insufficient documentation

## 2016-03-01 MED ORDER — NAPROXEN 500 MG PO TABS: 500.0000 mg | ORAL_TABLET | Freq: Two times a day (BID) | ORAL | 0 refills | Status: DC

## 2016-03-01 MED ORDER — KETOROLAC TROMETHAMINE 60 MG/2ML IM SOLN
60.0000 mg | Freq: Once | INTRAMUSCULAR | Status: AC
  Administered 2016-03-01: 60 mg via INTRAMUSCULAR
  Filled 2016-03-01: qty 2

## 2016-03-01 NOTE — Progress Notes (Signed)
Entered in d/c instructions please use the resources provided to you in ED to assist with finding a provider for follow up care

## 2016-03-01 NOTE — ED Triage Notes (Signed)
patient c/o left knee pain that has been intermittent for about 5 years.  Patient unsure if gout. Today while at work patient states that he couldn't bear weight on left leg so having to shift all weight to right side.

## 2016-03-01 NOTE — ED Provider Notes (Signed)
[place] DEPT Provider Note   CSN: [id number] Arrival date & time: 03/01/16  1417  By signing my name below, I, Rayna Sexton, attest that this documentation has been prepared under the direction and in the presence of Shawn C. Joy, PA-C. Electronically Signed: Rayna Sexton, ED Scribe. 03/01/16. 5:12 PM.   History   Chief Complaint Chief Complaint  Patient presents with  . Knee Pain    HPI HPI Comments: Derrick Shaffer is a 35 y.o. male who presents to the Emergency Department complaining of intermittent, mild, left knee pain x 5 years which worsened beginning last night. Pt states that his pain has been an ongoing issue. He reports associated laxity in the affected knee and when making turns feels as if "it is on the verge of popping". His pain worsens when bearing weight and with ambulation. He took advil this morning which provided mild short term relief of his pain. Denies recent injury, neuro deficits, or any other complaints.     The history is provided by the patient and medical records. No language interpreter was used.    History reviewed. No pertinent past medical history.  There are no active problems to display for this patient.   History reviewed. No pertinent surgical history.   Home Medications    Prior to Admission medications   Medication Sig Start Date End Date Taking? Authorizing Provider  Aspirin-Salicylamide-Caffeine (BC HEADACHE POWDER PO) Take 2 packets by mouth daily as needed (headaches).    Historical Provider, MD  cyclobenzaprine (FLEXERIL) 10 MG tablet Take 1 tablet (10 mg total) by mouth 2 (two) times daily as needed for muscle spasms. 12/07/13   Kaitlyn Szekalski, PA-C  ibuprofen (ADVIL,MOTRIN) 800 MG tablet Take 1 tablet (800 mg total) by mouth 3 (three) times daily. 10/14/15   Gloriann Loan, PA-C  meloxicam (MOBIC) 15 MG tablet Take 1 tablet (15 mg total) by mouth daily. 12/07/13   Kaitlyn Szekalski, PA-C  naproxen (NAPROSYN) 500 MG  tablet Take 1 tablet (500 mg total) by mouth 2 (two) times daily. 03/01/16   Lorayne Bender, PA-C    Family History No family history on file.  Social History Social History  Substance Use Topics  . Smoking status: Current Some Day Smoker  . Smokeless tobacco: Never Used  . Alcohol use Yes     Comment: occassionally    Allergies   Patient has no known allergies.   Review of Systems Review of Systems  Musculoskeletal: Positive for arthralgias. Negative for joint swelling.  Skin: Negative for color change and wound.  Neurological: Negative for weakness and numbness.   Physical Exam Updated Vital Signs BP 129/70 (BP Location: Left Arm)   Pulse 64   Temp 98.6 F (37 C) (Oral)   Resp 17   Ht 6' (1.829 m)   Wt 260 lb (117.9 kg)   SpO2 96%   BMI 35.26 kg/m   Physical Exam  Constitutional: He appears well-developed and well-nourished. No distress.  HENT:  Head: Normocephalic and atraumatic.  Eyes: Conjunctivae are normal.  Neck: Neck supple.  Cardiovascular: Normal rate, regular rhythm and intact distal pulses.   Pulmonary/Chest: Effort normal.  Musculoskeletal: Normal range of motion. He exhibits no edema, tenderness or deformity.  FROM of the left knee w/o swelling, tenderness, crepitus, laxity or noted effusion. Weight bearing on the left knee.   Neurological: He is alert.  No sensory deficits in the lower extremities. Strength with flexion and extension at the bilateral knees and ankles is  5 out of 5.  Skin: Skin is warm and dry. He is not diaphoretic.  Psychiatric: He has a normal mood and affect. His behavior is normal.  Nursing note and vitals reviewed.  ED Treatments / Results  Labs (all labs ordered are listed, but only abnormal results are displayed) Labs Reviewed - No data to display  EKG  EKG Interpretation None       Radiology Dg Knee Complete 4 Views Left  Result Date: 03/01/2016 CLINICAL DATA:  Nontraumatic left anterior knee pain, awakening  the patient last night. EXAM: LEFT KNEE - COMPLETE 4+ VIEW COMPARISON:  None. FINDINGS: Negative for acute fracture or dislocation. There is no radiopaque foreign body. There is a small knee joint effusion. No significant arthritic changes are evident. There is no bone lesion or bony destruction. IMPRESSION: Small joint effusion.  No acute bony abnormality. Electronically Signed   By: Andreas Newport M.D.   On: 03/01/2016 18:22    Procedures Procedures  DIAGNOSTIC STUDIES: Oxygen Saturation is 96% on RA, normal by my interpretation.    COORDINATION OF CARE: 5:10 PM Discussed next steps with pt. Pt verbalized understanding and is agreeable with the plan.    Medications Ordered in ED Medications  ketorolac (TORADOL) injection 60 mg (60 mg Intramuscular Given 03/01/16 1750)    Initial Impression / Assessment and Plan / ED Course  I have reviewed the triage vital signs and the nursing notes.  Pertinent labs & imaging results that were available during my care of the patient were reviewed by me and considered in my medical decision making (see chart for details).  Clinical Course     Patient presents with acute on chronic left knee pain. No significant abnormalities on x-ray. Orthopedic follow-up.    I personally performed the services described in this documentation, which was scribed in my presence. The recorded information has been reviewed and is accurate. Final Clinical Impressions(s) / ED Diagnoses   Final diagnoses:  Chronic pain of left knee    New Prescriptions Discharge Medication List as of 03/01/2016  6:46 PM    START taking these medications   Details  naproxen (NAPROSYN) 500 MG tablet Take 1 tablet (500 mg total) by mouth 2 (two) times daily., Starting Mon 03/01/2016, Print         Lorayne Bender, PA-C 03/02/16 1211    Lacretia Leigh, MD 03/02/16 [phone]

## 2016-03-01 NOTE — Discharge Instructions (Signed)
There were no significant abnormalities on the x-ray. Use the knee sleeve and the crutches as needed for comfort. Follow up with orthopedics on this matter as soon as possible. Use the number provided to set up an appointment.

## 2016-03-01 NOTE — Progress Notes (Signed)
Prior to entering pt room CM answered call button call Pt states he made a mistake and pushed call button trying to turn on tv Cm spoke with pt about pcp services  He confirms he does not have one Pt began to mumble that his mother was assisting him to find one and that his "job" may also offer one "not sure if from work or what" "depends on what shows up here"  Pt unable to confirm his injury is work related and confirms he does not have a IT consultant nor Tourist information centre manager assisting home with worker's compensation concerns.     CM discussed and provided written information to assist pt with determining choice for uninsured accepting pcps, discussed the importance of pcp vs EDP services for f/u care, www.needymeds.org, www.goodrx.com, discounted pharmacies and other [employer] such as [employer], affordable care act, financial assistance, uninsured dental services, [place] med assist, DSS and  health department  Reviewed resources for [employer] uninsured accepting pcps like [facility], family medicine at [employer], [facility], [facility], local urgent care centers, [facility], [facility] family practice, general medical clinics, family services of the [facility], [facility] urgent care plus others, medication resources, [facility] out patient pharmacies and housing Pt voiced understanding and appreciation of resources provided  Provided [facility] contact information

## 2016-05-27 DIAGNOSIS — M25562 Pain in left knee: Secondary | ICD-10-CM | POA: Insufficient documentation

## 2016-05-27 DIAGNOSIS — F17200 Nicotine dependence, unspecified, uncomplicated: Secondary | ICD-10-CM | POA: Insufficient documentation

## 2016-05-27 NOTE — ED Triage Notes (Signed)
Pain left knee  No known injury  Hx same in Nov 2017

## 2016-05-28 ENCOUNTER — Encounter (HOSPITAL_BASED_OUTPATIENT_CLINIC_OR_DEPARTMENT_OTHER): Payer: Self-pay | Admitting: Emergency Medicine

## 2016-05-28 ENCOUNTER — Emergency Department (HOSPITAL_BASED_OUTPATIENT_CLINIC_OR_DEPARTMENT_OTHER)
Admission: EM | Admit: 2016-05-28 | Discharge: 2016-05-28 | Disposition: A | Discharge status: Home / Self Care | Payer: Self-pay | Attending: Emergency Medicine | Admitting: Emergency Medicine

## 2016-05-28 DIAGNOSIS — M25562 Pain in left knee: Secondary | ICD-10-CM

## 2016-05-28 DIAGNOSIS — G8929 Other chronic pain: Secondary | ICD-10-CM

## 2016-05-28 MED ORDER — DEXAMETHASONE SODIUM PHOSPHATE 10 MG/ML IJ SOLN
10.0000 mg | Freq: Once | INTRAMUSCULAR | Status: AC
  Administered 2016-05-28: 10 mg via INTRAMUSCULAR
  Filled 2016-05-28: qty 1

## 2016-05-28 MED ORDER — NAPROXEN 500 MG PO TABS: 500.0000 mg | ORAL_TABLET | Freq: Two times a day (BID) | ORAL | 0 refills | Status: DC

## 2016-05-28 NOTE — ED Provider Notes (Signed)
[facility] DEPT [facility] Provider Note   CSN: [id number] Arrival date & time: 05/27/16  2347     History   Chief Complaint Chief Complaint  Patient presents with  . Knee Pain    HPI Derrick Shaffer is a 36 y.o. male.  HPI  36 y.o. male, presents to the Emergency Department today complaining of left knee pain x several months. Seen in past for same. Notes pain worse with flexion and ambulation. No pain at rest. Naprosyn with moderate relief. Notes no new trauma to area. States that he fell several months ago when pain initially started. No numbness/tingling. No fevers. No other symptoms noted.    History reviewed. No pertinent past medical history.  There are no active problems to display for this patient.   History reviewed. No pertinent surgical history.     Home Medications    Prior to Admission medications   Medication Sig Start Date End Date Taking? Authorizing Provider  Aspirin-Salicylamide-Caffeine (BC HEADACHE POWDER PO) Take 2 packets by mouth daily as needed (headaches).    Historical Provider, MD    Family History No family history on file.  Social History Social History  Substance Use Topics  . Smoking status: Current Some Day Smoker  . Smokeless tobacco: Never Used  . Alcohol use Yes     Comment: occassionally     Allergies   Patient has no known allergies.   Review of Systems Review of Systems  Constitutional: Negative for fever.  Musculoskeletal: Positive for arthralgias.  Neurological: Negative for numbness.   Physical Exam Updated Vital Signs BP 119/78 (BP Location: Right Arm)   Pulse 72   Temp 98.2 F (36.8 C) (Oral)   Resp 20   Ht 6' (1.829 m)   Wt 117.9 kg   SpO2 97%   BMI 35.26 kg/m   Physical Exam  Constitutional: He is oriented to person, place, and time. Vital signs are normal. He appears well-developed and well-nourished.  HENT:  Head: Normocephalic and atraumatic.  Right Ear: Hearing normal.  Left Ear: Hearing  normal.  Eyes: Conjunctivae and EOM are normal. Pupils are equal, round, and reactive to light.  Neck: Normal range of motion. Neck supple.  Cardiovascular: Normal rate, regular rhythm, normal heart sounds and intact distal pulses.   Pulmonary/Chest: Effort normal and breath sounds normal.  Musculoskeletal: Normal range of motion.  Left Knee with noted pain with flexion. Positive [place]. ACL/PCL/LCL/MCL remain intact. No obvious swelling. No erythema. Distal pulses appreciated. NVI.   Neurological: He is alert and oriented to person, place, and time.  Skin: Skin is warm and dry.  Psychiatric: He has a normal mood and affect. His speech is normal and behavior is normal. Thought content normal.  Nursing note and vitals reviewed.  ED Treatments / Results  Labs (all labs ordered are listed, but only abnormal results are displayed) Labs Reviewed - No data to display  EKG  EKG Interpretation None       Radiology No results found.  Procedures Procedures (including critical care time)  Medications Ordered in ED Medications - No data to display   Initial Impression / Assessment and Plan / ED Course  I have reviewed the triage vital signs and the nursing notes.  Pertinent labs & imaging results that were available during my care of the patient were reviewed by me and considered in my medical decision making (see chart for details).  Final Clinical Impressions(s) / ED Diagnoses     {I have reviewed  the relevant previous healthcare records.  {I obtained HPI from historian.   ED Course:  Assessment: Patient X-Ray negative for obvious fracture or dislocation previously. No new injuries since then. Pt advised to follow up with orthopedics. I believe there is possible meniscal injury. Pain with flexion only. Ligaments intact. Patient given knee brace while in ED, conservative therapy recommended and discussed. Patient will be discharged home & is agreeable with above plan. Returns  precautions discussed. Pt appears safe for discharge.  Disposition/Plan:  DC Home Additional Verbal discharge instructions given and discussed with patient.  Pt Instructed to f/u with Ortho in the next week for evaluation and treatment of symptoms. Return precautions given Pt acknowledges and agrees with plan  Supervising Physician April Palumbo, MD  Final diagnoses:  Chronic pain of left knee    New Prescriptions New Prescriptions   No medications on file     Shary Decamp, PA-C 05/28/16 [place], MD 05/28/16 1152

## 2016-05-28 NOTE — Discharge Instructions (Signed)
Please read and follow all provided instructions.  Your diagnoses today include:  1. Chronic pain of left knee     Tests performed today include: Vital signs. See below for your results today.   Medications prescribed:  Take as prescribed   Home care instructions:  Follow any educational materials contained in this packet.  Follow-up instructions: Please follow-up with your orthopedics for further evaluation of symptoms and treatment   Return instructions:  Please return to the Emergency Department if you do not get better, if you get worse, or new symptoms OR  - Fever (temperature greater than 101.23F)  - Bleeding that does not stop with holding pressure to the area    -Severe pain (please note that you may be more sore the day after your accident)  - Chest Pain  - Difficulty breathing  - Severe nausea or vomiting  - Inability to tolerate food and liquids  - Passing out  - Skin becoming red around your wounds  - Change in mental status (confusion or lethargy)  - New numbness or weakness    Please return if you have any other emergent concerns.  Additional Information:  Your vital signs today were: BP 119/78 (BP Location: Right Arm)    Pulse 72    Temp 98.2 F (36.8 C) (Oral)    Resp 20    Ht 6' (1.829 m)    Wt 117.9 kg    SpO2 97%    BMI 35.26 kg/m  If your blood pressure (BP) was elevated above 135/85 this visit, please have this repeated by your doctor within one month. ---------------

## 2016-06-03 ENCOUNTER — Encounter (HOSPITAL_COMMUNITY): Payer: Self-pay | Admitting: *Deleted

## 2016-06-03 ENCOUNTER — Emergency Department (HOSPITAL_COMMUNITY)
Admission: EM | Admit: 2016-06-03 | Discharge: 2016-06-03 | Disposition: A | Discharge status: Home / Self Care | Payer: BLUE CROSS/BLUE SHIELD | Attending: Emergency Medicine | Admitting: Emergency Medicine

## 2016-06-03 DIAGNOSIS — Z79899 Other long term (current) drug therapy: Secondary | ICD-10-CM | POA: Insufficient documentation

## 2016-06-03 DIAGNOSIS — F17200 Nicotine dependence, unspecified, uncomplicated: Secondary | ICD-10-CM | POA: Insufficient documentation

## 2016-06-03 DIAGNOSIS — Z7982 Long term (current) use of aspirin: Secondary | ICD-10-CM | POA: Insufficient documentation

## 2016-06-03 DIAGNOSIS — M10071 Idiopathic gout, right ankle and foot: Secondary | ICD-10-CM

## 2016-06-03 MED ORDER — PREDNISONE 50 MG PO TABS: 50.0000 mg | ORAL_TABLET | Freq: Every day | ORAL | 0 refills | Status: DC

## 2016-06-03 MED ORDER — HYDROCODONE-ACETAMINOPHEN 5-325 MG PO TABS: 1.0000 | ORAL_TABLET | Freq: Four times a day (QID) | ORAL | 0 refills | Status: DC | PRN

## 2016-06-03 NOTE — ED Provider Notes (Signed)
[facility] DEPT Provider Note   CSN: [id number] Arrival date & time: 06/03/16  2135     History   Chief Complaint Chief Complaint  Patient presents with  . Foot Pain    HPI Derrick Shaffer is a 36 y.o. male.  HPI Patient presents to the emergency department with pain to the right great toe.  The patient states that movement and palpation make the pain worse.  He states that the pain started yesterday.  The patient states that his brother and father both have gout and states that he has similar symptoms to what they normally describe states that nothing seems to make the condition better.  Patient denies fever, nausea, vomiting, swelling, or syncope History reviewed. No pertinent past medical history.  There are no active problems to display for this patient.   History reviewed. No pertinent surgical history.     Home Medications    Prior to Admission medications   Medication Sig Start Date End Date Taking? Authorizing Provider  Aspirin-Salicylamide-Caffeine (BC HEADACHE POWDER PO) Take 2 packets by mouth daily as needed (headaches).    Historical Provider, MD  naproxen (NAPROSYN) 500 MG tablet Take 1 tablet (500 mg total) by mouth 2 (two) times daily. 05/28/16   Shary Decamp, PA-C    Family History No family history on file.  Social History Social History  Substance Use Topics  . Smoking status: Current Some Day Smoker  . Smokeless tobacco: Never Used  . Alcohol use Yes     Comment: occassionally     Allergies   Patient has no known allergies.   Review of Systems Review of Systems All other systems negative except as documented in the HPI. All pertinent positives and negatives as reviewed in the HPI.  Physical Exam Updated Vital Signs BP (!) 141/85 (BP Location: Left Arm)   Pulse 78   Temp 98.1 F (36.7 C) (Oral)   Resp 20   SpO2 100%   Physical Exam  Constitutional: He is oriented to person, place, and time. He appears well-developed and  well-nourished. No distress.  HENT:  Head: Normocephalic and atraumatic.  Eyes: Pupils are equal, round, and reactive to light.  Pulmonary/Chest: Effort normal.  Musculoskeletal:       Feet:  Neurological: He is alert and oriented to person, place, and time.  Skin: Skin is warm and dry.  Psychiatric: He has a normal mood and affect.  Nursing note and vitals reviewed.    ED Treatments / Results  Labs (all labs ordered are listed, but only abnormal results are displayed) Labs Reviewed - No data to display  EKG  EKG Interpretation None       Radiology No results found.  Procedures Procedures (including critical care time)  Medications Ordered in ED Medications - No data to display   Initial Impression / Assessment and Plan / ED Course  I have reviewed the triage vital signs and the nursing notes.  Pertinent labs & imaging results that were available during my care of the patient were reviewed by me and considered in my medical decision making (see chart for details).     Patient be treated like a gout heights and area.  Told to return here as needed.  Patient agrees the plan and all questions were answered.  I asked him to follow with a primary care doctor  Final Clinical Impressions(s) / ED Diagnoses   Final diagnoses:  None    New Prescriptions New Prescriptions   No medications  on file     Dalia Heading, PA-C 06/03/16 [place], MD 06/05/16 [phone]

## 2016-06-03 NOTE — Discharge Instructions (Signed)
Return here as needed.  Follow-up with a primary care doctor.  You can use ice on your foot and toe

## 2016-06-03 NOTE — ED Notes (Signed)
Bed: [facility]PT1 Expected date:  Expected time:  Means of arrival:  Comments: 

## 2016-06-03 NOTE — ED Notes (Signed)
Bed: WTR5 Expected date:  Expected time:  Means of arrival:  Comments: 

## 2016-06-03 NOTE — ED Triage Notes (Signed)
Pt complains of pain his right foot for the past 24 hours. Pt states he is concerned he has gout.

## 2016-06-20 ENCOUNTER — Encounter (HOSPITAL_COMMUNITY): Payer: Self-pay

## 2016-06-20 ENCOUNTER — Emergency Department (HOSPITAL_COMMUNITY)
Admission: EM | Admit: 2016-06-20 | Discharge: 2016-06-20 | Disposition: A | Discharge status: Home / Self Care | Payer: BLUE CROSS/BLUE SHIELD | Attending: Emergency Medicine | Admitting: Emergency Medicine

## 2016-06-20 DIAGNOSIS — Z7982 Long term (current) use of aspirin: Secondary | ICD-10-CM | POA: Insufficient documentation

## 2016-06-20 DIAGNOSIS — F17200 Nicotine dependence, unspecified, uncomplicated: Secondary | ICD-10-CM | POA: Diagnosis not present

## 2016-06-20 DIAGNOSIS — M79671 Pain in right foot: Secondary | ICD-10-CM | POA: Insufficient documentation

## 2016-06-20 DIAGNOSIS — M79672 Pain in left foot: Secondary | ICD-10-CM

## 2016-06-20 MED ORDER — NAPROXEN 500 MG PO TABS: 500.0000 mg | ORAL_TABLET | Freq: Two times a day (BID) | ORAL | 0 refills | Status: DC

## 2016-06-20 NOTE — ED Notes (Signed)
Pt understood dc material. NAD noted. Scripts given at dc 

## 2016-06-20 NOTE — ED Triage Notes (Signed)
Pt complaining of R foot pain. Pt states recently seen for tendonitis in R foot. Pt states feels same. Pt denies any new injury/trauma to foot.

## 2016-06-20 NOTE — ED Provider Notes (Signed)
[facility] DEPT Provider Note   CSN: [id number] Arrival date & time: 06/20/16  0240     History   Chief Complaint Chief Complaint  Patient presents with  . Foot Pain    HPI Derrick Shaffer is a 36 y.o. male.  HPI   36 year old male presenting complaining of right foot pain. Patient reports he has had recurrent left foot pain. He describes pain as a sharp achy sensation to the dorsum of his foot worse with prolonged standing, and with pivoting his foot.  He reports having this pain for at least several months and has been seen and evaluated several times in the ED for this. He received opioid medication, steroid, and NSAIDs in the past with some relief. He mentioned he is been taking NSAIDs at home lately without adequate relief. After walking for 10 hours today tonight he was having increasing pain with ambulation and decided to come here for further evaluation. He was told that he may have gout in the past but he does not think he has gout because he has no pain to his toe. He denies any injury. He does admit to wearing steel toe shoes with his work and thinks it may attributed to his pain. Denies any similar pain to his left foot. Denies any ankle pain, numbness or weakness. Denies any recent trauma.  History reviewed. No pertinent past medical history.  There are no active problems to display for this patient.   History reviewed. No pertinent surgical history.     Home Medications    Prior to Admission medications   Medication Sig Start Date End Date Taking? Authorizing Provider  Aspirin-Salicylamide-Caffeine (BC HEADACHE POWDER PO) Take 2 packets by mouth daily as needed (headaches).    Historical Provider, MD  HYDROcodone-acetaminophen (NORCO/VICODIN) 5-325 MG tablet Take 1 tablet by mouth every 6 (six) hours as needed for moderate pain. 06/03/16   Dalia Heading, PA-C  naproxen (NAPROSYN) 500 MG tablet Take 1 tablet (500 mg total) by mouth 2 (two) times daily.  05/28/16   Shary Decamp, PA-C  predniSONE (DELTASONE) 50 MG tablet Take 1 tablet (50 mg total) by mouth daily. 06/03/16   Dalia Heading, PA-C    Family History History reviewed. No pertinent family history.  Social History Social History  Substance Use Topics  . Smoking status: Current Some Day Smoker  . Smokeless tobacco: Never Used  . Alcohol use Yes     Comment: occassionally     Allergies   Patient has no known allergies.   Review of Systems Review of Systems  Constitutional: Negative for fever.  Musculoskeletal: Positive for arthralgias.  Skin: Negative for rash and wound.  Neurological: Negative for numbness.     Physical Exam Updated Vital Signs BP 132/88 (BP Location: Left Arm)   Pulse 78   Temp 97.7 F (36.5 C) (Oral)   Resp 16   SpO2 97%   Physical Exam  Constitutional: He appears well-developed and well-nourished. No distress.  HENT:  Head: Atraumatic.  Eyes: Conjunctivae are normal.  Neck: Neck supple.  Musculoskeletal: He exhibits tenderness (Right foot: Tenderness to the dorsum of the foot without any swelling, or to full 2 noted. No crepitus. Intact distal pedal pulse, brisk cap refills to all toes with normal toe range of motion. No pain at fifth metatarsal region, no pain to the sole foot. ).  Right ankle with full range of motion.  Neurological: He is alert.  Skin: No rash noted.  Psychiatric: He has a  normal mood and affect.  Nursing note and vitals reviewed.    ED Treatments / Results  Labs (all labs ordered are listed, but only abnormal results are displayed) Labs Reviewed - No data to display  EKG  EKG Interpretation None       Radiology No results found.  Procedures Procedures (including critical care time)  Medications Ordered in ED Medications - No data to display   Initial Impression / Assessment and Plan / ED Course  I have reviewed the triage vital signs and the nursing notes.  Pertinent labs & imaging results  that were available during my care of the patient were reviewed by me and considered in my medical decision making (see chart for details).     BP 127/85   Pulse 75   Temp 97.7 F (36.5 C) (Oral)   Resp 18   SpO2 99%    Final Clinical Impressions(s) / ED Diagnoses   Final diagnoses:  Foot pain, left    New Prescriptions Current Discharge Medication List     4:50 AM Patient here with reproducible tenderness to the dorsum of his right foot. No skin changes, no warmth, no erythematous skin changes to suggest joint infection or cellulitis. Patient has had x-ray of his right foot recently with shows no concerning finding. At this time I recommend rice therapy, and outpatient follow-up with podiatrist for further evaluation.   Domenic Moras, PA-C 06/20/16 [place], DO 06/20/16 [phone]

## 2016-06-20 NOTE — Discharge Instructions (Signed)
Please call and follow up with a podiatrist for further evaluation and management of your recurrent left foot pain.  Take Naproxen with food for pain.  Alternate between ice and heating pad as it may help with your pain.

## 2016-07-08 ENCOUNTER — Emergency Department (HOSPITAL_COMMUNITY)
Admission: EM | Admit: 2016-07-08 | Discharge: 2016-07-08 | Disposition: A | Discharge status: Home / Self Care | Payer: BLUE CROSS/BLUE SHIELD | Attending: Emergency Medicine | Admitting: Emergency Medicine

## 2016-07-08 DIAGNOSIS — F17200 Nicotine dependence, unspecified, uncomplicated: Secondary | ICD-10-CM | POA: Diagnosis not present

## 2016-07-08 DIAGNOSIS — J069 Acute upper respiratory infection, unspecified: Secondary | ICD-10-CM | POA: Insufficient documentation

## 2016-07-08 DIAGNOSIS — Z79899 Other long term (current) drug therapy: Secondary | ICD-10-CM | POA: Diagnosis not present

## 2016-07-08 DIAGNOSIS — Z7982 Long term (current) use of aspirin: Secondary | ICD-10-CM | POA: Diagnosis not present

## 2016-07-08 DIAGNOSIS — R05 Cough: Secondary | ICD-10-CM | POA: Diagnosis present

## 2016-07-08 MED ORDER — FLUTICASONE PROPIONATE 50 MCG/ACT NA SUSP: 1.0000 | Freq: Every day | NASAL | 2 refills | Status: DC

## 2016-07-08 MED ORDER — BENZONATATE 100 MG PO CAPS: 100.0000 mg | ORAL_CAPSULE | Freq: Three times a day (TID) | ORAL | 0 refills | Status: DC

## 2016-07-08 NOTE — Discharge Instructions (Signed)
Please read attached information. If you experience any new or worsening signs or symptoms please return to the emergency room for evaluation. Please follow-up with your primary care provider or specialist as discussed. Please use medication prescribed only as directed and discontinue taking if you have any concerning signs or symptoms.   °

## 2016-07-08 NOTE — ED Triage Notes (Signed)
Pt c/o gradual onset cough, nasal congestion, yellow rhinorrhea, scant epistaxis, chills, chest pain with cough onset 3 days ago. No chest pain when not coughing. Self-treated with Mucinex without relief. No body aches, no sinus tenderness.

## 2016-07-08 NOTE — ED Provider Notes (Signed)
[facility] DEPT Provider Note   CSN: [id number] Arrival date & time: 07/08/16  1709   By signing my name below, I, Neta Mends, attest that this documentation has been prepared under the direction and in the presence of [employer], PA-C. Electronically Signed: Neta Mends, ED Scribe. 07/08/2016. 5:45 PM.   History   Chief Complaint Chief Complaint  Patient presents with  . Cough  . Nasal Congestion    The history is provided by the patient. No language interpreter was used.   HPI Comments:  Derrick Shaffer is a 36 y.o. male who presents to the Emergency Department complaining of a persistent cough x 3 days. He states that the cough is productive of yellow sputum. Pt complains of associated nasal congestion, sinus pain, rhinorrhea, intermittent epistaxis, chills, and chest pain with cough. He states that he only has chest pain when coughs, and he notes that the pain is 10/10 when coughing. He reports hx of seasonal allergies. Pt has taken mucinex with mild, temporary relief. Pt denies other associated symptoms.    No past medical history on file.  There are no active problems to display for this patient.   No past surgical history on file.     Home Medications    Prior to Admission medications   Medication Sig Start Date End Date Taking? Authorizing Provider  Aspirin-Salicylamide-Caffeine (BC HEADACHE POWDER PO) Take 2 packets by mouth daily as needed (headaches).    Historical Provider, MD  benzonatate (TESSALON) 100 MG capsule Take 1 capsule (100 mg total) by mouth every 8 (eight) hours. 07/08/16   Okey Regal, PA-C  fluticasone (FLONASE) 50 MCG/ACT nasal spray Place 1 spray into both nostrils daily. 07/08/16   Okey Regal, PA-C  naproxen (NAPROSYN) 500 MG tablet Take 1 tablet (500 mg total) by mouth 2 (two) times daily. 06/20/16   Domenic Moras, PA-C    Family History No family history on file.  Social History Social History  Substance Use  Topics  . Smoking status: Current Some Day Smoker  . Smokeless tobacco: Never Used  . Alcohol use Yes     Comment: occassionally     Allergies   Patient has no known allergies.   Review of Systems Review of Systems  Constitutional: Positive for chills.  HENT: Positive for congestion, nosebleeds, rhinorrhea and sinus pain.   Respiratory: Positive for cough.   Cardiovascular: Positive for chest pain.  All other systems reviewed and are negative.    Physical Exam Updated Vital Signs BP 127/81 (BP Location: Left Arm)   Pulse 64   Temp 98.1 F (36.7 C) (Oral)   Resp 20   Wt 108 kg   SpO2 98%   BMI 32.28 kg/m   Physical Exam  Constitutional: He appears well-developed and well-nourished. No distress.  HENT:  Head: Normocephalic and atraumatic.  Right Ear: External ear normal.  Left Ear: External ear normal.  Nose: Nose normal.  Mouth/Throat: Oropharynx is clear and moist.  Eyes: Conjunctivae are normal.  Cardiovascular: Normal rate.   Pulmonary/Chest: Effort normal and breath sounds normal. He has no wheezes. He has no rales.  Abdominal: He exhibits no distension.  Neurological: He is alert.  Skin: Skin is warm and dry.  Psychiatric: He has a normal mood and affect.  Nursing note and vitals reviewed.    ED Treatments / Results  DIAGNOSTIC STUDIES:  Oxygen Saturation is 98% on RA, normal by my interpretation.    COORDINATION OF CARE:  5:40 PM  Discussed treatment plan with pt at bedside and pt agreed to plan.   Labs (all labs ordered are listed, but only abnormal results are displayed) Labs Reviewed - No data to display  EKG  EKG Interpretation None       Radiology No results found.  Procedures Procedures (including critical care time)  Medications Ordered in ED Medications - No data to display   Initial Impression / Assessment and Plan / ED Course  I have reviewed the triage vital signs and the nursing notes.  Pertinent labs & imaging  results that were available during my care of the patient were reviewed by me and considered in my medical decision making (see chart for details).      Final Clinical Impressions(s) / ED Diagnoses   Final diagnoses:  Viral upper respiratory tract infection    Discharge Meds: Flonase, Ladona Ridgel  Assessment/Plan: 36 year old male presents today with likely upper respiratory infection.  Patient notes some seasonal Allergies, but has no appreciable signs of allergies at this time.  He is very well-appearing with no significant congestion, clear lung sounds afebrile nontoxic.  Patient will be given Flonase, cough medication encouraged to return if any new or worsening signs or symptoms present.  Patient verbalized his understanding and agreement to today's plan had no further questions or concerns  New Prescriptions Discharge Medication List as of 07/08/2016  6:00 PM    START taking these medications   Details  benzonatate (TESSALON) 100 MG capsule Take 1 capsule (100 mg total) by mouth every 8 (eight) hours., Starting Thu 07/08/2016, Print    fluticasone (FLONASE) 50 MCG/ACT nasal spray Place 1 spray into both nostrils daily., Starting Thu 07/08/2016, Print      I personally performed the services described in this documentation, which was scribed in my presence. The recorded information has been reviewed and is accurate.     Okey Regal, PA-C 07/08/16 Vernelle Emerald    Virgel Manifold, MD 07/09/16 [phone]

## 2016-12-03 ENCOUNTER — Encounter (HOSPITAL_COMMUNITY): Payer: Self-pay

## 2016-12-03 DIAGNOSIS — R079 Chest pain, unspecified: Secondary | ICD-10-CM | POA: Diagnosis not present

## 2016-12-03 DIAGNOSIS — F17200 Nicotine dependence, unspecified, uncomplicated: Secondary | ICD-10-CM | POA: Insufficient documentation

## 2016-12-03 DIAGNOSIS — R002 Palpitations: Secondary | ICD-10-CM | POA: Insufficient documentation

## 2016-12-03 DIAGNOSIS — R05 Cough: Secondary | ICD-10-CM | POA: Insufficient documentation

## 2016-12-03 NOTE — ED Triage Notes (Signed)
Pt presents c/o of a cough x a week. He states that it is dry and not productive. Pt also states that he felt like his blood pressure was high at work, but he's not sure. Hx of diabetes and hypertension in his family, but has never been diagnosed himself.

## 2016-12-04 ENCOUNTER — Emergency Department (HOSPITAL_COMMUNITY)
Admission: EM | Admit: 2016-12-04 | Discharge: 2016-12-04 | Disposition: A | Discharge status: Home / Self Care | Payer: BLUE CROSS/BLUE SHIELD | Attending: Emergency Medicine | Admitting: Emergency Medicine

## 2016-12-04 ENCOUNTER — Emergency Department (HOSPITAL_COMMUNITY): Payer: BLUE CROSS/BLUE SHIELD

## 2016-12-04 DIAGNOSIS — R059 Cough, unspecified: Secondary | ICD-10-CM

## 2016-12-04 DIAGNOSIS — R05 Cough: Secondary | ICD-10-CM

## 2016-12-04 DIAGNOSIS — R079 Chest pain, unspecified: Secondary | ICD-10-CM

## 2016-12-04 LAB — CBC WITH DIFFERENTIAL/PLATELET
Basophils Absolute: 0.1 10*3/uL (ref 0.0–0.1)
Basophils Relative: 1 %
Eosinophils Absolute: 0.1 10*3/uL (ref 0.0–0.7)
Eosinophils Relative: 2 %
HCT: 40.3 % (ref 39.0–52.0)
Hemoglobin: 13.3 g/dL (ref 13.0–17.0)
Lymphocytes Relative: 30 %
Lymphs Abs: 1.8 10*3/uL (ref 0.7–4.0)
MCH: 26.9 pg (ref 26.0–34.0)
MCHC: 33 g/dL (ref 30.0–36.0)
MCV: 81.4 fL (ref 78.0–100.0)
Monocytes Absolute: 0.4 10*3/uL (ref 0.1–1.0)
Monocytes Relative: 6 %
Neutro Abs: 3.6 10*3/uL (ref 1.7–7.7)
Neutrophils Relative %: 61 %
Platelets: 300 10*3/uL (ref 150–400)
RBC: 4.95 MIL/uL (ref 4.22–5.81)
RDW: 13.9 % (ref 11.5–15.5)
WBC: 5.9 10*3/uL (ref 4.0–10.5)

## 2016-12-04 LAB — COMPREHENSIVE METABOLIC PANEL
ALT: 20 U/L (ref 17–63)
AST: 22 U/L (ref 15–41)
Albumin: 4.2 g/dL (ref 3.5–5.0)
Alkaline Phosphatase: 45 U/L (ref 38–126)
Anion gap: 7 (ref 5–15)
BUN: 16 mg/dL (ref 6–20)
CO2: 28 mmol/L (ref 22–32)
Calcium: 8.9 mg/dL (ref 8.9–10.3)
Chloride: 109 mmol/L (ref 101–111)
Creatinine, Ser: 1.29 mg/dL — ABNORMAL HIGH (ref 0.61–1.24)
GFR calc Af Amer: >60 mL/min (ref >60)
GFR calc non Af Amer: >60 mL/min (ref >60)
Glucose, Bld: 138 mg/dL — ABNORMAL HIGH (ref 65–99)
Potassium: 3.7 mmol/L (ref 3.5–5.1)
Sodium: 144 mmol/L (ref 135–145)
Total Bilirubin: 0.8 mg/dL (ref 0.3–1.2)
Total Protein: 7.1 g/dL (ref 6.5–8.1)

## 2016-12-04 LAB — LIPASE, BLOOD: Lipase: 37 U/L (ref 11–51)

## 2016-12-04 LAB — I-STAT TROPONIN, ED: Troponin i, poc: 0.02 ng/mL (ref 0.00–0.08)

## 2016-12-04 MED ORDER — BENZONATATE 100 MG PO CAPS: 100.0000 mg | ORAL_CAPSULE | Freq: Three times a day (TID) | ORAL | 0 refills | Status: DC

## 2016-12-04 MED ORDER — IBUPROFEN 600 MG PO TABS: 600.0000 mg | ORAL_TABLET | Freq: Four times a day (QID) | ORAL | 0 refills | Status: DC | PRN

## 2016-12-04 NOTE — ED Notes (Signed)
Patient transported to X-ray 

## 2016-12-04 NOTE — Discharge Instructions (Signed)
Medications: ibuprofen, Tessalon  Treatment: Take ibuprofen every 6 hours as needed for muscle pain and chest pain. Take Tessalon every 8 hours as needed for cough. Avoid caffeine.  Follow-up: Please follow-up and establish care with a primary care provider by calling the number circled on your discharge paperwork. Please also follow up with cardiology for further evaluation if your chest pain continues. Please return to emergency department if you develop any new or worsening symptoms.

## 2016-12-04 NOTE — ED Provider Notes (Signed)
[facility] DEPT Provider Note   CSN: [id number] Arrival date & time: 12/03/16  2254     History   Chief Complaint Chief Complaint  Patient presents with  . Cough    HPI Derrick Shaffer is a 36 y.o. male who was previously healthy who presents with a one-week history of cough and several month history of intermittent chest pain. He reports intermittent palpitations and sharp pains. He does not have chest pain on my evaluation but he did when he checked in. He denies working out or heavy lifting. He did used to be an Printmaker. He reports he does drink Red Bull regularly. He denies pleuritic symptoms. He denies any shortness of breath. Denies any new leg pain or swelling, recent long trips, surgeries, cancer, history of blood clots. He is not taking any medications at home for this chest pain, however has taken Mucinex for his cough.  HPI  History reviewed. No pertinent past medical history.  There are no active problems to display for this patient.   History reviewed. No pertinent surgical history.     Home Medications    Prior to Admission medications   Medication Sig Start Date End Date Taking? Authorizing Provider  Aspirin-Salicylamide-Caffeine (BC HEADACHE POWDER PO) Take 2 packets by mouth daily as needed (headaches).   Yes [provider]  benzonatate (TESSALON) 100 MG capsule Take 1 capsule (100 mg total) by mouth every 8 (eight) hours. 12/04/16   Flora Ratz, Bea Graff, PA-C  fluticasone (FLONASE) 50 MCG/ACT nasal spray Place 1 spray into both nostrils daily. Patient not taking: Reported on 12/04/2016 07/08/16   Hedges, Dellis Filbert, PA-C  ibuprofen (ADVIL,MOTRIN) 600 MG tablet Take 1 tablet (600 mg total) by mouth every 6 (six) hours as needed. 12/04/16   Jovian Lembcke, Bea Graff, PA-C  naproxen (NAPROSYN) 500 MG tablet Take 1 tablet (500 mg total) by mouth 2 (two) times daily. Patient not taking: Reported on 12/04/2016 06/20/16   Domenic Moras, PA-C    Family History History  reviewed. No pertinent family history.  Social History Social History  Substance Use Topics  . Smoking status: Current Some Day Smoker  . Smokeless tobacco: Never Used  . Alcohol use Yes     Comment: occassionally     Allergies   Patient has no known allergies.   Review of Systems Review of Systems  Constitutional: Negative for chills and fever.  HENT: Negative for facial swelling and sore throat.   Respiratory: Positive for cough. Negative for shortness of breath.   Cardiovascular: Positive for chest pain and palpitations.  Gastrointestinal: Negative for abdominal pain, nausea and vomiting.  Genitourinary: Negative for dysuria.  Musculoskeletal: Negative for back pain.  Skin: Negative for rash and wound.  Neurological: Negative for headaches.  Psychiatric/Behavioral: The patient is not nervous/anxious.      Physical Exam Updated Vital Signs BP 123/82   Pulse (!) 52   Temp (S) 98 F (36.7 C) (Oral)   Resp 15   SpO2 97%   Physical Exam  Constitutional: He appears well-developed and well-nourished. No distress.  HENT:  Head: Normocephalic and atraumatic.  Mouth/Throat: Oropharynx is clear and moist. No oropharyngeal exudate.  Eyes: Pupils are equal, round, and reactive to light. Conjunctivae are normal. Right eye exhibits no discharge. Left eye exhibits no discharge. No scleral icterus.  Neck: Normal range of motion. Neck supple. No thyromegaly present.  Cardiovascular: Normal rate, regular rhythm, normal heart sounds and intact distal pulses.  Exam reveals no gallop and no friction  rub.   No murmur heard. Pulmonary/Chest: Effort normal and breath sounds normal. No stridor. No respiratory distress. He has no wheezes. He has no rales. He exhibits tenderness.    Abdominal: Soft. Bowel sounds are normal. He exhibits no distension. There is no tenderness. There is no rebound and no guarding.  Musculoskeletal: He exhibits no edema.  Lymphadenopathy:    He has no  cervical adenopathy.  Neurological: He is alert. Coordination normal.  Skin: Skin is warm and dry. No rash noted. He is not diaphoretic. No pallor.  Psychiatric: He has a normal mood and affect.  Nursing note and vitals reviewed.    ED Treatments / Results  Labs (all labs ordered are listed, but only abnormal results are displayed) Labs Reviewed  COMPREHENSIVE METABOLIC PANEL - Abnormal; Notable for the following:       Result Value   Glucose, Bld 138 (*)    Creatinine, Ser 1.29 (*)    All other components within normal limits  LIPASE, BLOOD  CBC WITH DIFFERENTIAL/PLATELET  I-STAT TROPONIN, ED    EKG  EKG Interpretation None       Radiology Dg Chest 2 View  Result Date: 12/04/2016 CLINICAL DATA:  Cough and chest pain for a week. Family history of diabetes and hypertension. EXAM: CHEST  2 VIEW COMPARISON:  None. FINDINGS: The heart size and mediastinal contours are within normal limits. Both lungs are clear. The visualized skeletal structures are unremarkable. IMPRESSION: No active cardiopulmonary disease. Electronically Signed   By: Lucienne Capers M.D.   On: 12/04/2016 05:52    Procedures Procedures (including critical care time)  Medications Ordered in ED Medications - No data to display   Initial Impression / Assessment and Plan / ED Course  I have reviewed the triage vital signs and the nursing notes.  Pertinent labs & imaging results that were available during my care of the patient were reviewed by me and considered in my medical decision making (see chart for details).     Patient with atypical, reproducible chest pain. CBC unremarkable. CMP shows creatinine 1.29, glucose 138. Lipase 37. Troponin negative. Considering chest pain began over 4 hours ago, no indication for delta. troponin at this time. EKG shows sinus bradycardia. 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 He states he typically has heart rate in the 50s.  Chest x-ray is negative. PERC negative. I feel patient's chest pain is probably musculoskeletal, however patient advised to follow up with PCP and/or cardiology if symptoms are continuing. Will treat cough with Tessalon and chest pain with ibuprofen. Strict return precautions given. Patient understands and agrees with plan. Patient vitals stable throughout ED course and discharged in satisfactory condition.  Final Clinical Impressions(s) / ED Diagnoses   Final diagnoses:  Cough  Nonspecific chest pain    New Prescriptions Discharge Medication List as of 12/04/2016  7:22 AM    START taking these medications   Details  ibuprofen (ADVIL,MOTRIN) 600 MG tablet Take 1 tablet (600 mg total) by mouth every 6 (six) hours as needed., Starting Sat 12/04/2016, Print         Xan Sparkman, Bea Graff, PA-C 12/04/16 2229    Lajean Saver, MD 12/07/16 1109

## 2017-01-16 ENCOUNTER — Emergency Department (HOSPITAL_COMMUNITY)
Admission: EM | Admit: 2017-01-16 | Discharge: 2017-01-16 | Disposition: A | Discharge status: Home / Self Care | Payer: BLUE CROSS/BLUE SHIELD | Attending: Emergency Medicine | Admitting: Emergency Medicine

## 2017-01-16 ENCOUNTER — Other Ambulatory Visit: Payer: Self-pay

## 2017-01-16 ENCOUNTER — Encounter (HOSPITAL_COMMUNITY): Payer: Self-pay | Admitting: *Deleted

## 2017-01-16 DIAGNOSIS — F17210 Nicotine dependence, cigarettes, uncomplicated: Secondary | ICD-10-CM | POA: Insufficient documentation

## 2017-01-16 DIAGNOSIS — Z79899 Other long term (current) drug therapy: Secondary | ICD-10-CM | POA: Diagnosis not present

## 2017-01-16 DIAGNOSIS — Z791 Long term (current) use of non-steroidal anti-inflammatories (NSAID): Secondary | ICD-10-CM | POA: Insufficient documentation

## 2017-01-16 DIAGNOSIS — M79672 Pain in left foot: Secondary | ICD-10-CM | POA: Diagnosis present

## 2017-01-16 MED ORDER — PREDNISONE 10 MG PO TABS: ORAL_TABLET | ORAL | 0 refills | Status: DC

## 2017-01-16 MED ORDER — METHYLPREDNISOLONE SODIUM SUCC 125 MG IJ SOLR
125.0000 mg | Freq: Once | INTRAMUSCULAR | Status: AC
  Administered 2017-01-16: 125 mg via INTRAMUSCULAR
  Filled 2017-01-16: qty 2

## 2017-01-16 NOTE — Discharge Instructions (Signed)
See the Neurologist as scheduled

## 2017-01-16 NOTE — ED Triage Notes (Signed)
Pt has history of left foot pain that he has been evaluated for in the past, unknown as to cause, no injury, has been on steroids, has an appointment next month with Neurology. Steroids did help.

## 2017-01-19 NOTE — ED Provider Notes (Signed)
[facility] DEPT Provider Note   CSN: [id number] Arrival date & time: 01/16/17  5809     History   Chief Complaint Chief Complaint  Patient presents with  . Foot Pain    Left    HPI Derrick Shaffer is a 36 y.o. male.  The history is provided by the patient. No language interpreter was used.  Foot Pain  This is a new problem. The problem occurs constantly. The problem has been gradually worsening. Nothing aggravates the symptoms. Nothing relieves the symptoms. He has tried nothing for the symptoms. The treatment provided no relief.  Pt complains of left foot pain.  Pt request cortisone.  Pt is scheduled to see a neurologist.   History reviewed. No pertinent past medical history.  There are no active problems to display for this patient.   History reviewed. No pertinent surgical history.     Home Medications    Prior to Admission medications   Medication Sig Start Date End Date Taking? Authorizing Provider  Aspirin-Salicylamide-Caffeine (BC HEADACHE POWDER PO) Take 2 packets by mouth daily as needed (headaches).    [provider]  benzonatate (TESSALON) 100 MG capsule Take 1 capsule (100 mg total) by mouth every 8 (eight) hours. 12/04/16   Law, Bea Graff, PA-C  fluticasone (FLONASE) 50 MCG/ACT nasal spray Place 1 spray into both nostrils daily. Patient not taking: Reported on 12/04/2016 07/08/16   Hedges, Dellis Filbert, PA-C  ibuprofen (ADVIL,MOTRIN) 600 MG tablet Take 1 tablet (600 mg total) by mouth every 6 (six) hours as needed. 12/04/16   Law, Bea Graff, PA-C  naproxen (NAPROSYN) 500 MG tablet Take 1 tablet (500 mg total) by mouth 2 (two) times daily. Patient not taking: Reported on 12/04/2016 06/20/16   Domenic Moras, PA-C  predniSONE (DELTASONE) 10 MG tablet 9,8,3,3,8,2 taper 01/16/17   Fransico Meadow, PA-C    Family History No family history on file.  Social History Social History   Tobacco Use  . Smoking status: Current Some  Day Smoker  . Smokeless tobacco: Never Used  Substance Use Topics  . Alcohol use: Yes    Comment: occassionally  . Drug use: No     Allergies   Patient has no known allergies.   Review of Systems Review of Systems  All other systems reviewed and are negative.    Physical Exam Updated Vital Signs BP 125/82 (BP Location: Right Arm)   Pulse (!) 53   Temp 97.6 F (36.4 C) (Oral)   Resp 14   Ht 6' (1.829 m)   Wt 108.9 kg (240 lb)   SpO2 98%   BMI 32.55 kg/m   Physical Exam  Constitutional: He is oriented to person, place, and time. He appears well-developed and well-nourished.  HENT:  Head: Normocephalic.  Eyes: EOM are normal.  Neck: Normal range of motion.  Pulmonary/Chest: Effort normal.  Abdominal: He exhibits no distension.  Musculoskeletal: Normal range of motion. He exhibits no edema, tenderness or deformity.  Neurological: He is alert and oriented to person, place, and time.  Psychiatric: He has a normal mood and affect.  Nursing note and vitals reviewed.    ED Treatments / Results  Labs (all labs ordered are listed, but only abnormal results are displayed) Labs Reviewed - No data to display  EKG  EKG Interpretation None       Radiology No results found.  Procedures Procedures (including critical care time)  Medications Ordered in ED Medications  methylPREDNISolone sodium succinate (SOLU-MEDROL)  125 mg/2 mL injection 125 mg (125 mg Intramuscular Given 01/16/17 1329)     Initial Impression / Assessment and Plan / ED Course  I have reviewed the triage vital signs and the nursing notes.  Pertinent labs & imaging results that were available during my care of the patient were reviewed by me and considered in my medical decision making (see chart for details).     Pt advised to keep follow up with neurology  Final Clinical Impressions(s) / ED Diagnoses   Final diagnoses:  Foot pain, left    ED Discharge Orders        Ordered     predniSONE (DELTASONE) 10 MG tablet     01/16/17 1315    An After Visit Summary was printed and given to the patient.    Fransico Meadow, PA-C 01/19/17 1631    Davonna Belling, MD 01/20/17 [phone]

## 2017-06-23 ENCOUNTER — Emergency Department (HOSPITAL_COMMUNITY)
Admission: EM | Admit: 2017-06-23 | Discharge: 2017-06-23 | Disposition: A | Discharge status: Home / Self Care | Payer: BLUE CROSS/BLUE SHIELD | Attending: Emergency Medicine | Admitting: Emergency Medicine

## 2017-06-23 ENCOUNTER — Encounter (HOSPITAL_COMMUNITY): Payer: Self-pay

## 2017-06-23 DIAGNOSIS — R066 Hiccough: Secondary | ICD-10-CM | POA: Diagnosis present

## 2017-06-23 DIAGNOSIS — Z79899 Other long term (current) drug therapy: Secondary | ICD-10-CM | POA: Insufficient documentation

## 2017-06-23 DIAGNOSIS — F17210 Nicotine dependence, cigarettes, uncomplicated: Secondary | ICD-10-CM | POA: Insufficient documentation

## 2017-06-23 DIAGNOSIS — Z7982 Long term (current) use of aspirin: Secondary | ICD-10-CM | POA: Insufficient documentation

## 2017-06-23 MED ORDER — PANTOPRAZOLE SODIUM 20 MG PO TBEC: 20.0000 mg | DELAYED_RELEASE_TABLET | Freq: Every day | ORAL | 1 refills | Status: AC

## 2017-06-23 MED ORDER — FAMOTIDINE 20 MG PO TABS: 20.0000 mg | ORAL_TABLET | Freq: Two times a day (BID) | ORAL | 0 refills | Status: AC

## 2017-06-23 NOTE — ED Provider Notes (Signed)
Patient placed in Quick Look pathway, seen and evaluated   Chief Complaint: Hiccups  HPI:   KOBI MARIO is a 37 y.o. male, presenting to the ED with hiccups for the past 7 days.  States his hiccups have been intermittent.  Seem to arise when he is hungry, improve temporarily when eating or drinking.  He also notes some acid reflux symptoms with burning in the chest during hiccuping.  Having normal bowel movements.  No increase in flatus. Denies shortness of breath, fever, vomiting, diarrhea, neuro abnormalities, or recent illness.  ROS: Hiccups (one)  Physical Exam:   Gen: No distress  Neuro: Awake and Alert  Skin: Warm    Focused Exam:   No diaphoresis.  No pallor.  Pulmonary: No increased work of breathing.  Speaks in full sentences without difficulty.  Lung sounds clear.  No tachypnea.  Cardiac: Normal rate and regular. Peripheral pulses intact.  Abdominal: No abdominal tenderness.  No peritoneal signs.  No rebound tenderness.  No guarding.  No CVA tenderness.   Initiation of care has begun. The patient has been counseled on the process, plan, and necessity for staying for the completion/evaluation, and the remainder of the medical screening examination   Layla Maw 06/23/17 [place], Kevin, MD 06/23/17 1436

## 2017-06-23 NOTE — ED Triage Notes (Signed)
Pt presents for evaluation of ongoing hiccups x 7 days. Reports has developed pain to ribs and chest during hiccups. No hiccups currently. Reports some GERD hx.

## 2017-06-23 NOTE — ED Provider Notes (Signed)
[facility] EMERGENCY DEPARTMENT Provider Note   CSN: [id number] Arrival date & time: 06/23/17  1326     History   Chief Complaint Chief Complaint  Patient presents with  . Hiccups    HPI Derrick Shaffer is a 37 y.o. male.  HPI   Derrick Shaffer is a 37 y.o. male, patient with no pertinent past medical history, presenting to the ED with hiccups intermittent for last 7 days. Seem to arise when he is hungry, improve temporarily when eating or drinking.  He also notes some acid reflux symptoms with burning in the chest during hiccuping. Chest soreness with hiccups, but none otherwise. Having normal bowel movements.  No increase in flatus. Denies shortness of breath, fever, vomiting, diarrhea, neuro abnormalities, or recent illness.   History reviewed. No pertinent past medical history.  There are no active problems to display for this patient.   History reviewed. No pertinent surgical history.      Home Medications    Prior to Admission medications   Medication Sig Start Date End Date Taking? Authorizing Provider  Aspirin-Salicylamide-Caffeine (BC HEADACHE POWDER PO) Take 2 packets by mouth daily as needed (headaches).    [provider]  benzonatate (TESSALON) 100 MG capsule Take 1 capsule (100 mg total) by mouth every 8 (eight) hours. 12/04/16   Law, Bea Graff, PA-C  famotidine (PEPCID) 20 MG tablet Take 1 tablet (20 mg total) by mouth 2 (two) times daily for 5 days. 06/23/17 06/28/17  Joy, Shawn C, PA-C  fluticasone (FLONASE) 50 MCG/ACT nasal spray Place 1 spray into both nostrils daily. Patient not taking: Reported on 12/04/2016 07/08/16   Hedges, Dellis Filbert, PA-C  ibuprofen (ADVIL,MOTRIN) 600 MG tablet Take 1 tablet (600 mg total) by mouth every 6 (six) hours as needed. 12/04/16   Law, Bea Graff, PA-C  naproxen (NAPROSYN) 500 MG tablet Take 1 tablet (500 mg total) by mouth 2 (two) times daily. Patient not taking: Reported on 12/04/2016 06/20/16    Domenic Moras, PA-C  pantoprazole (PROTONIX) 20 MG tablet Take 1 tablet (20 mg total) by mouth daily. 06/23/17 08/22/17  Joy, Helane Gunther, PA-C  predniSONE (DELTASONE) 10 MG tablet 6,5,4,3,2,1 taper 01/16/17   Fransico Meadow, PA-C    Family History No family history on file.  Social History Social History   Tobacco Use  . Smoking status: Current Some Day Smoker  . Smokeless tobacco: Never Used  Substance Use Topics  . Alcohol use: Yes    Comment: occassionally  . Drug use: No     Allergies   Patient has no known allergies.   Review of Systems Review of Systems  Constitutional: Negative for chills, diaphoresis and fever.  Respiratory: Negative for shortness of breath.   Cardiovascular: Negative for leg swelling.  Gastrointestinal: Negative for abdominal pain, diarrhea, nausea and vomiting.       Hiccups  Musculoskeletal: Negative for back pain.  All other systems reviewed and are negative.    Physical Exam Updated Vital Signs BP 123/78 (BP Location: Right Arm)   Pulse 72   Temp 99 F (37.2 C) (Oral)   Resp 16   SpO2 98%   Physical Exam  Constitutional: He appears well-developed and well-nourished. No distress.  HENT:  Head: Normocephalic and atraumatic.  Eyes: Conjunctivae are normal.  Neck: Neck supple.  Cardiovascular: Normal rate, regular rhythm, normal heart sounds and intact distal pulses.  Pulmonary/Chest: Effort normal and breath sounds normal. No respiratory distress.  Abdominal: Soft. There is  no tenderness. There is no guarding.  Musculoskeletal: He exhibits no edema.  Lymphadenopathy:    He has no cervical adenopathy.  Neurological: He is alert.  Skin: Skin is warm and dry. He is not diaphoretic.  Psychiatric: He has a normal mood and affect. His behavior is normal.  Nursing note and vitals reviewed.    ED Treatments / Results  Labs (all labs ordered are listed, but only abnormal results are displayed) Labs Reviewed - No data to  display  EKG EKG Interpretation  Date/Time:  Thursday June 23 2017 14:09:49 EDT Ventricular Rate:  66 PR Interval:  160 QRS Duration: 82 QT Interval:  392 QTC Calculation: 410 R Axis:   95 Text Interpretation:  Normal sinus rhythm with sinus arrhythmia Rightward axis Cannot rule out Anterior infarct , age undetermined Abnormal ECG No significant change was found Confirmed by Jola Schmidt [phone]) on 06/23/2017 3:44:20 PM   Radiology No results found.  Procedures Procedures (including critical care time)  Medications Ordered in ED Medications - No data to display   Initial Impression / Assessment and Plan / ED Course  I have reviewed the triage vital signs and the nursing notes.  Pertinent labs & imaging results that were available during my care of the patient were reviewed by me and considered in my medical decision making (see chart for details).     Patient presents with intermittent hiccups.  Improves with eating or drinking.  EKG without features of ischemia or STEMI.  Protonix, Pepcid, and GI follow-up.  Final Clinical Impressions(s) / ED Diagnoses   Final diagnoses:  Hiccups    ED Discharge Orders        Ordered    pantoprazole (PROTONIX) 20 MG tablet  Daily     06/23/17 1454    famotidine (PEPCID) 20 MG tablet  2 times daily     06/23/17 1454       Layla Maw 06/23/17 2023    Jola Schmidt, MD 06/23/17 2307

## 2017-06-23 NOTE — Discharge Instructions (Signed)
°  Diet: Start with a clear liquid diet, progressed to a full liquid diet, and then bland solids as you are able. Please adhere to the enclosed dietary suggestions.  In general, avoid NSAIDs (i.e. ibuprofen, naproxen, etc.), caffeine, alcohol, spicy foods, fatty foods, or any other foods that seem to cause your symptoms to arise.  Protonix: Take this medication daily, 20-30 minutes prior to your first meal, for the next 8 weeks.  Continue to take this medication even if you begin to feel better.  Pepcid: Take this medication twice a day for the next 5 days.  Follow-up: Please follow-up with a primary care provider or gastroenterology on this matter.  Return: Return to the ED for significantly worsening symptoms, persistent vomiting, persistent fever, vomiting blood, blood in the stools, dark stools, or any other major concerns.

## 2017-08-06 ENCOUNTER — Encounter (HOSPITAL_COMMUNITY): Payer: Self-pay

## 2017-08-06 ENCOUNTER — Other Ambulatory Visit: Payer: Self-pay

## 2017-08-06 ENCOUNTER — Emergency Department (HOSPITAL_COMMUNITY)
Admission: EM | Admit: 2017-08-06 | Discharge: 2017-08-06 | Disposition: A | Discharge status: Home / Self Care | Payer: BLUE CROSS/BLUE SHIELD | Attending: Emergency Medicine | Admitting: Emergency Medicine

## 2017-08-06 DIAGNOSIS — F17200 Nicotine dependence, unspecified, uncomplicated: Secondary | ICD-10-CM | POA: Diagnosis not present

## 2017-08-06 DIAGNOSIS — J069 Acute upper respiratory infection, unspecified: Secondary | ICD-10-CM | POA: Diagnosis not present

## 2017-08-06 DIAGNOSIS — R05 Cough: Secondary | ICD-10-CM | POA: Diagnosis present

## 2017-08-06 DIAGNOSIS — Z79899 Other long term (current) drug therapy: Secondary | ICD-10-CM | POA: Diagnosis not present

## 2017-08-06 MED ORDER — FLUTICASONE PROPIONATE 50 MCG/ACT NA SUSP: 2.0000 | Freq: Every day | NASAL | 0 refills | Status: DC

## 2017-08-06 MED ORDER — BENZONATATE 100 MG PO CAPS: 100.0000 mg | ORAL_CAPSULE | Freq: Three times a day (TID) | ORAL | 0 refills | Status: DC

## 2017-08-06 NOTE — ED Triage Notes (Signed)
He c/o uri sx plus cough productive of small amounts of "yellow" phlegm since this Thurs. He is in no distress.

## 2017-08-06 NOTE — Discharge Instructions (Signed)
You were given a perception for Flonase and benzonatate to help with your nasal congestion and with your cough.  Please take all medications as directed on your discharge paperwork.  Please follow up with your primary care provider within 5-7 days for re-evaluation of your symptoms. Please return to the emergency department for any new or worsening symptoms.

## 2017-08-06 NOTE — ED Provider Notes (Signed)
[facility] DEPT Provider Note   CSN: [id number] Arrival date & time: 08/06/17  1733     History   Chief Complaint Chief Complaint  Patient presents with  . URI    HPI Derrick Shaffer is a 37 y.o. male.  HPI   Patient is a 37 year old male presents emergency department today complaining of a cough, nasal congestion, rhinorrhea and chest wall pain with coughing for the last 2 days.  He has had an intermittently productive cough with yellow sputum.  States that he initially had body aches and felt like he had a fever however he did not take his temperature.  He denies any chest pain when he is not coughing.  Denies any sore throat when he is not coughing.  No ear pain.  No shortness of breath.  No abdominal pain nausea or vomiting.  No diarrhea or urinary symptoms.  States he is tried taking over-the-counter Zyrtec, Mucinex and another medication to help with his symptoms which temporarily improved his symptoms however his cough is persistent which is his main complaint today.  History reviewed. No pertinent past medical history.  There are no active problems to display for this patient.   No past surgical history on file.      Home Medications    Prior to Admission medications   Medication Sig Start Date End Date Taking? Authorizing Provider  Aspirin-Salicylamide-Caffeine (BC HEADACHE POWDER PO) Take 2 packets by mouth daily as needed (headaches).    [provider]  benzonatate (TESSALON) 100 MG capsule Take 1 capsule (100 mg total) by mouth every 8 (eight) hours. 08/06/17   Hannelore Bova S, PA-C  famotidine (PEPCID) 20 MG tablet Take 1 tablet (20 mg total) by mouth 2 (two) times daily for 5 days. 06/23/17 06/28/17  Joy, Shawn C, PA-C  fluticasone (FLONASE) 50 MCG/ACT nasal spray Place 2 sprays into both nostrils daily. 08/06/17   Shavona Gunderman S, PA-C  ibuprofen (ADVIL,MOTRIN) 600 MG tablet Take 1 tablet (600 mg total) by mouth every  6 (six) hours as needed. 12/04/16   Law, Bea Graff, PA-C  naproxen (NAPROSYN) 500 MG tablet Take 1 tablet (500 mg total) by mouth 2 (two) times daily. Patient not taking: Reported on 12/04/2016 06/20/16   Domenic Moras, PA-C  pantoprazole (PROTONIX) 20 MG tablet Take 1 tablet (20 mg total) by mouth daily. 06/23/17 08/22/17  Joy, Helane Gunther, PA-C  predniSONE (DELTASONE) 10 MG tablet 6,5,4,3,2,1 taper 01/16/17   Fransico Meadow, PA-C    Family History No family history on file.  Social History Social History   Tobacco Use  . Smoking status: Current Some Day Smoker  . Smokeless tobacco: Never Used  Substance Use Topics  . Alcohol use: Yes    Comment: occassionally  . Drug use: No     Allergies   Patient has no known allergies.   Review of Systems Review of Systems  Constitutional: Positive for chills.  HENT: Positive for congestion and rhinorrhea. Negative for ear pain, sore throat and trouble swallowing.   Eyes: Negative for visual disturbance.  Respiratory: Positive for cough. Negative for shortness of breath.   Cardiovascular:       Chest wall pain with cough  Gastrointestinal: Negative for abdominal pain, nausea and vomiting.  Genitourinary: Negative for flank pain.  Musculoskeletal: Positive for myalgias.  Skin: Negative for wound.  Neurological: Negative for dizziness, light-headedness and headaches.   Physical Exam Updated Vital Signs BP (!) 148/102 (BP Location: Left  Arm)   Pulse 90   Temp 98.1 F (36.7 C) (Oral)   Resp 18   SpO2 100%   Physical Exam  Constitutional: He appears well-developed and well-nourished.  HENT:  Head: Normocephalic and atraumatic.  Bilateral TMs without erythema or effusion.  No pharyngeal erythema.  No tonsillar swelling or exudates.  Tolerating secretions.  Normal voice.  Eyes: Pupils are equal, round, and reactive to light. Conjunctivae and EOM are normal.  Neck: Normal range of motion. Neck supple.  Cardiovascular: Normal rate, regular  rhythm, normal heart sounds and intact distal pulses.  No murmur heard. Pulmonary/Chest: Effort normal and breath sounds normal. No respiratory distress.  Dry cough on exam  Abdominal: Soft. Bowel sounds are normal. He exhibits no distension. There is no tenderness. There is no guarding.  Musculoskeletal: He exhibits no edema.  Neurological: He is alert.  Skin: Skin is warm and dry.  Psychiatric: He has a normal mood and affect.  Nursing note and vitals reviewed.  ED Treatments / Results  Labs (all labs ordered are listed, but only abnormal results are displayed) Labs Reviewed - No data to display  EKG None  Radiology No results found.  Procedures Procedures (including critical care time)  Medications Ordered in ED Medications - No data to display   Initial Impression / Assessment and Plan / ED Course  I have reviewed the triage vital signs and the nursing notes.  Pertinent labs & imaging results that were available during my care of the patient were reviewed by me and considered in my medical decision making (see chart for details).   Final Clinical Impressions(s) / ED Diagnoses   Final diagnoses:  Upper respiratory tract infection, unspecified type    Patients symptoms are consistent with URI, likely viral etiology.  Symptoms have only been present for 48 hours and patient is afebrile.  Do not suspect pneumonia as lungs sound clear and patient has been afebrile.  Discussed that antibiotics are not indicated for viral infections. Pt will be discharged with symptomatic treatment.  Verbalizes understanding and is agreeable with plan. Pt is hemodynamically stable & in NAD prior to dc.  Advised PCP follow-up in 1 week for reevaluation and return to the emergency department if any new or worsening symptoms develop.  ED Discharge Orders        Ordered    fluticasone (FLONASE) 50 MCG/ACT nasal spray  Daily     08/06/17 1906    benzonatate (TESSALON) 100 MG capsule  Every 8  hours     08/06/17 1906       Bishop Dublin 08/06/17 1906    Milton Ferguson, MD 08/07/17 2338

## 2017-11-03 ENCOUNTER — Other Ambulatory Visit: Payer: Self-pay

## 2017-11-03 ENCOUNTER — Emergency Department (HOSPITAL_COMMUNITY)
Admission: EM | Admit: 2017-11-03 | Discharge: 2017-11-03 | Disposition: A | Discharge status: Home / Self Care | Payer: BLUE CROSS/BLUE SHIELD | Attending: Emergency Medicine | Admitting: Emergency Medicine

## 2017-11-03 ENCOUNTER — Encounter (HOSPITAL_COMMUNITY): Payer: Self-pay | Admitting: *Deleted

## 2017-11-03 DIAGNOSIS — R112 Nausea with vomiting, unspecified: Secondary | ICD-10-CM | POA: Insufficient documentation

## 2017-11-03 DIAGNOSIS — F17210 Nicotine dependence, cigarettes, uncomplicated: Secondary | ICD-10-CM | POA: Insufficient documentation

## 2017-11-03 DIAGNOSIS — R1013 Epigastric pain: Secondary | ICD-10-CM | POA: Insufficient documentation

## 2017-11-03 LAB — COMPREHENSIVE METABOLIC PANEL
ALBUMIN: 4 g/dL (ref 3.5–5.0)
ALK PHOS: 40 U/L (ref 38–126)
ALT: 21 U/L (ref 0–44)
AST: 22 U/L (ref 15–41)
Anion gap: 8 (ref 5–15)
BILIRUBIN TOTAL: 0.8 mg/dL (ref 0.3–1.2)
BUN: 9 mg/dL (ref 6–20)
CO2: 25 mmol/L (ref 22–32)
Calcium: 8.9 mg/dL (ref 8.9–10.3)
Chloride: 109 mmol/L (ref 98–111)
Creatinine, Ser: 1.39 mg/dL — ABNORMAL HIGH (ref 0.61–1.24)
GFR calc Af Amer: >60 mL/min (ref >60)
GFR calc non Af Amer: >60 mL/min (ref >60)
GLUCOSE: 111 mg/dL — AB (ref 70–99)
Potassium: 3.5 mmol/L (ref 3.5–5.1)
SODIUM: 142 mmol/L (ref 135–145)
Total Protein: 6.6 g/dL (ref 6.5–8.1)

## 2017-11-03 LAB — URINALYSIS, ROUTINE W REFLEX MICROSCOPIC
BILIRUBIN URINE: NEGATIVE
Glucose, UA: NEGATIVE mg/dL
HGB URINE DIPSTICK: NEGATIVE
KETONES UR: NEGATIVE mg/dL
Leukocytes, UA: NEGATIVE
Nitrite: NEGATIVE
Protein, ur: NEGATIVE mg/dL
Specific Gravity, Urine: 1.025 (ref 1.005–1.030)
pH: 5 (ref 5.0–8.0)

## 2017-11-03 LAB — CBC
HCT: 42.7 % (ref 39.0–52.0)
Hemoglobin: 13.6 g/dL (ref 13.0–17.0)
MCH: 26.7 pg (ref 26.0–34.0)
MCHC: 31.9 g/dL (ref 30.0–36.0)
MCV: 83.9 fL (ref 78.0–100.0)
Platelets: 273 10*3/uL (ref 150–400)
RBC: 5.09 MIL/uL (ref 4.22–5.81)
RDW: 13.2 % (ref 11.5–15.5)
WBC: 4.7 10*3/uL (ref 4.0–10.5)

## 2017-11-03 LAB — LIPASE, BLOOD: Lipase: 43 U/L (ref 11–51)

## 2017-11-03 MED ORDER — SUCRALFATE 1 G PO TABS: 1.0000 g | ORAL_TABLET | Freq: Three times a day (TID) | ORAL | 0 refills | Status: AC

## 2017-11-03 MED ORDER — DICYCLOMINE HCL 20 MG PO TABS: 20.0000 mg | ORAL_TABLET | Freq: Three times a day (TID) | ORAL | 0 refills | Status: AC | PRN

## 2017-11-03 MED ORDER — ONDANSETRON 4 MG PO TBDP: 4.0000 mg | ORAL_TABLET | Freq: Three times a day (TID) | ORAL | 0 refills | Status: AC | PRN

## 2017-11-03 MED ORDER — SUCRALFATE 1 G PO TABS
1.0000 g | ORAL_TABLET | Freq: Once | ORAL | Status: AC
  Administered 2017-11-03: 1 g via ORAL
  Filled 2017-11-03: qty 1

## 2017-11-03 MED ORDER — DICYCLOMINE HCL 10 MG PO CAPS
10.0000 mg | ORAL_CAPSULE | Freq: Once | ORAL | Status: AC
  Administered 2017-11-03: 10 mg via ORAL
  Filled 2017-11-03: qty 1

## 2017-11-03 MED ORDER — ONDANSETRON 4 MG PO TBDP
4.0000 mg | ORAL_TABLET | Freq: Once | ORAL | Status: AC
  Administered 2017-11-03: 4 mg via ORAL
  Filled 2017-11-03: qty 1

## 2017-11-03 NOTE — ED Triage Notes (Signed)
The pt is c/o of abd pain since 2200 last pm  Hx of pancreatitis  Vomited once

## 2017-11-03 NOTE — ED Provider Notes (Signed)
Emergency Department Provider Note   I have reviewed the triage vital signs and the nursing notes.   HISTORY  Chief Complaint Abdominal Pain   HPI Derrick Shaffer is a 37 y.o. male with PMH of pancreatitis presents to the emergency department for evaluation of epigastric abdominal pain with vomiting.  Symptoms began at 10 PM yesterday. He describes as a cramping pain without radiation.  No shortness of breath or chest pain.  No fevers or chills.  States he has had a distant past medical history of pancreatitis but at that time he was drinking heavily.  He has not been drinking alcohol or using any drugs.  No sick contacts or travel.    History reviewed. No pertinent past medical history.  There are no active problems to display for this patient.   History reviewed. No pertinent surgical history.  Allergies Patient has no known allergies.  No family history on file.  Social History Social History   Tobacco Use  . Smoking status: Current Some Day Smoker  . Smokeless tobacco: Never Used  Substance Use Topics  . Alcohol use: Yes    Comment: occassionally  . Drug use: No    Review of Systems  Constitutional: No fever/chills Eyes: No visual changes. ENT: No sore throat. Cardiovascular: Denies chest pain. Respiratory: Denies shortness of breath. Gastrointestinal: Positive epigastric abdominal pain. Positive nausea and vomiting.  No diarrhea.  No constipation. Genitourinary: Negative for dysuria. Musculoskeletal: Negative for back pain. Skin: Negative for rash. Neurological: Negative for headaches, focal weakness or numbness.  10-point ROS otherwise negative.  ____________________________________________   PHYSICAL EXAM:  VITAL SIGNS: ED Triage Vitals  Enc Vitals Group     BP 11/03/17 0402 120/78     Pulse Rate 11/03/17 0402 (!) 57     Resp 11/03/17 0402 18     Temp 11/03/17 0402 98 F (36.7 C)     Temp Source 11/03/17 0651 Oral     SpO2 11/03/17  0402 98 %     Weight 11/03/17 0405 245 lb (111.1 kg)     Height 11/03/17 0405 6' (1.829 m)     Pain Score 11/03/17 0405 10   Constitutional: Alert and oriented. Well appearing and in no acute distress. Eyes: Conjunctivae are normal.  Head: Atraumatic. Nose: No congestion/rhinnorhea. Mouth/Throat: Mucous membranes are moist. Neck: No stridor.  Cardiovascular: Normal rate, regular rhythm. Good peripheral circulation. Grossly normal heart sounds.   Respiratory: Normal respiratory effort.  No retractions. Lungs CTAB. Gastrointestinal: Soft with mild epigastric tenderness to palpation. No RUQ tenderness. No rebound or guarding. No distention.  Musculoskeletal: No lower extremity tenderness nor edema. No gross deformities of extremities. Neurologic:  Normal speech and language. No gross focal neurologic deficits are appreciated.  Skin:  Skin is warm, dry and intact. No rash noted.   ____________________________________________   LABS (all labs ordered are listed, but only abnormal results are displayed)  Labs Reviewed  COMPREHENSIVE METABOLIC PANEL - Abnormal; Notable for the following components:      Result Value   Glucose, Bld 111 (*)    Creatinine, Ser 1.39 (*)    All other components within normal limits  URINALYSIS, ROUTINE W REFLEX MICROSCOPIC - Abnormal; Notable for the following components:   APPearance HAZY (*)    All other components within normal limits  LIPASE, BLOOD  CBC   ____________________________________________  RADIOLOGY  None ____________________________________________   PROCEDURES  Procedure(s) performed:   Procedures  None ____________________________________________   INITIAL IMPRESSION /  ASSESSMENT AND PLAN / ED COURSE  Pertinent labs & imaging results that were available during my care of the patient were reviewed by me and considered in my medical decision making (see chart for details).  Patient presents to the emergency department  for evaluation of epigastric abdominal pain with vomiting.  Labs ordered in triage are largely unremarkable with normal lipase, normal LFTs, and normal bilirubin.  No leukocytosis.  Normal urinalysis.  Patient symptoms seem consistent with a mild gastritis.  He has no focal tenderness on abdominal exam to prompt abdominal imaging at this time.  Plan for p.o. Zofran, Bentyl, Carafate, and PO challenge.   07:50 AM Patient feeling better after PO meds. He is tolerating PO in the ED. Plan for discharge with supportive care plan at home and PCP follow up.   At this time, I do not feel there is any life-threatening condition present. I have reviewed and discussed all results (EKG, imaging, lab, urine as appropriate), exam findings with patient. I have reviewed nursing notes and appropriate previous records.  I feel the patient is safe to be discharged home without further emergent workup. Discussed usual and customary return precautions. Patient and family (if present) verbalize understanding and are comfortable with this plan.  Patient will follow-up with their primary care provider. If they do not have a primary care provider, information for follow-up has been provided to them. All questions have been answered.  ____________________________________________  FINAL CLINICAL IMPRESSION(S) / ED DIAGNOSES  Final diagnoses:  Epigastric pain  Non-intractable vomiting with nausea, unspecified vomiting type     MEDICATIONS GIVEN DURING THIS VISIT:  Medications  dicyclomine (BENTYL) capsule 10 mg (10 mg Oral Given 11/03/17 0723)  ondansetron (ZOFRAN-ODT) disintegrating tablet 4 mg (4 mg Oral Given 11/03/17 0723)  sucralfate (CARAFATE) tablet 1 g (1 g Oral Given 11/03/17 0723)     NEW OUTPATIENT MEDICATIONS STARTED DURING THIS VISIT:  New Prescriptions   DICYCLOMINE (BENTYL) 20 MG TABLET    Take 1 tablet (20 mg total) by mouth 3 (three) times daily as needed for spasms (and abdominal cramping).    ONDANSETRON (ZOFRAN ODT) 4 MG DISINTEGRATING TABLET    Take 1 tablet (4 mg total) by mouth every 8 (eight) hours as needed for nausea or vomiting.   SUCRALFATE (CARAFATE) 1 G TABLET    Take 1 tablet (1 g total) by mouth 4 (four) times daily -  with meals and at bedtime for 7 days.    Note:  This document was prepared using Dragon voice recognition software and may include unintentional dictation errors.  Nanda Quinton, MD Emergency Medicine    Broughton Eppinger, [facility], MD 11/03/17 [phone]

## 2017-11-03 NOTE — Discharge Instructions (Signed)

## 2017-11-03 NOTE — ED Notes (Signed)
Pt reports taking 20mg  pepcid and 20mg  protonix around 0000, with no lasting relief.

## 2018-11-06 IMAGING — CR DG KNEE COMPLETE 4+V*L*
4 series · 4 of 4 positions shown · non-contrast
Comparison: None.

CLINICAL DATA: Nontraumatic left anterior knee pain, awakening the
patient last night.

EXAM:
LEFT KNEE - COMPLETE 4+ VIEW

[t knee ap left]
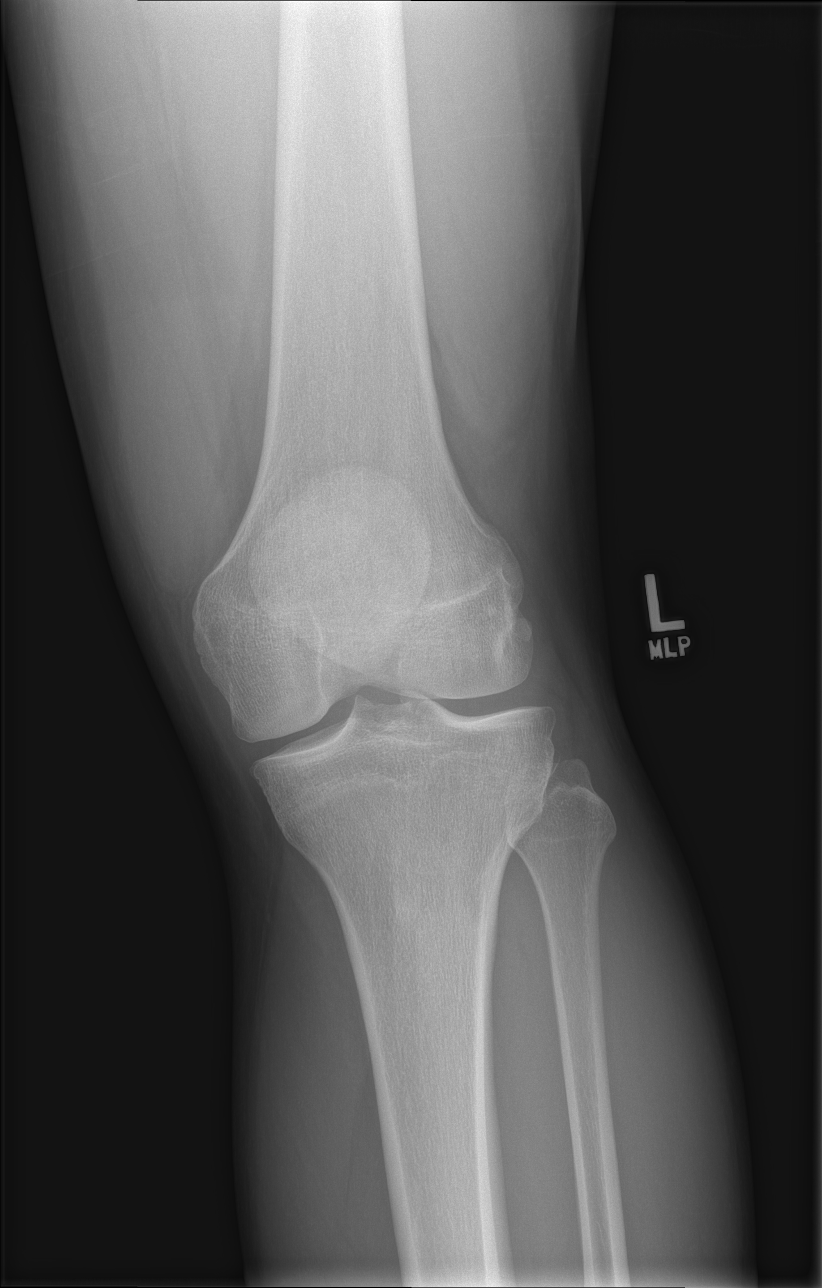

[t knee obl left (1 of 2)]
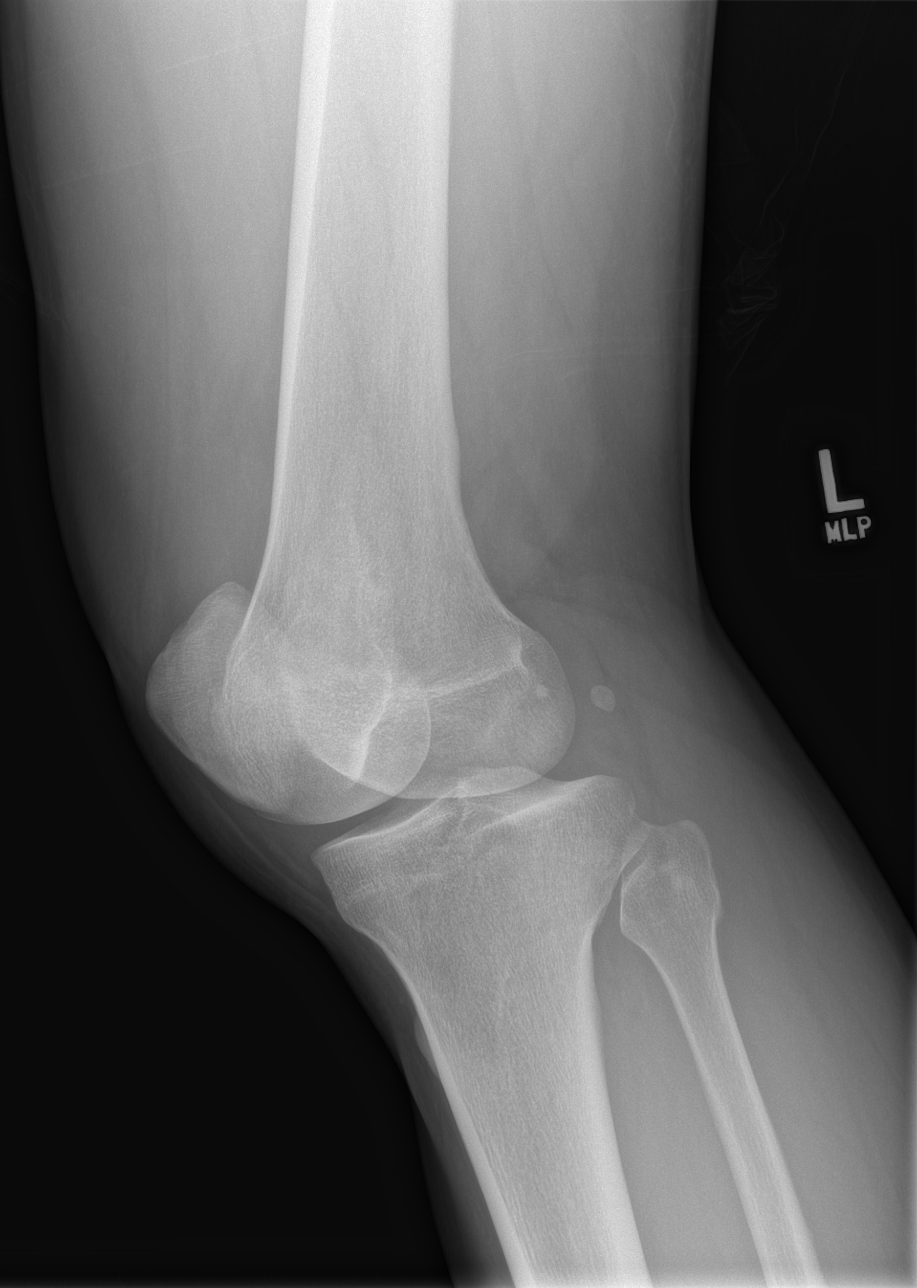

[t knee obl left (2 of 2)]
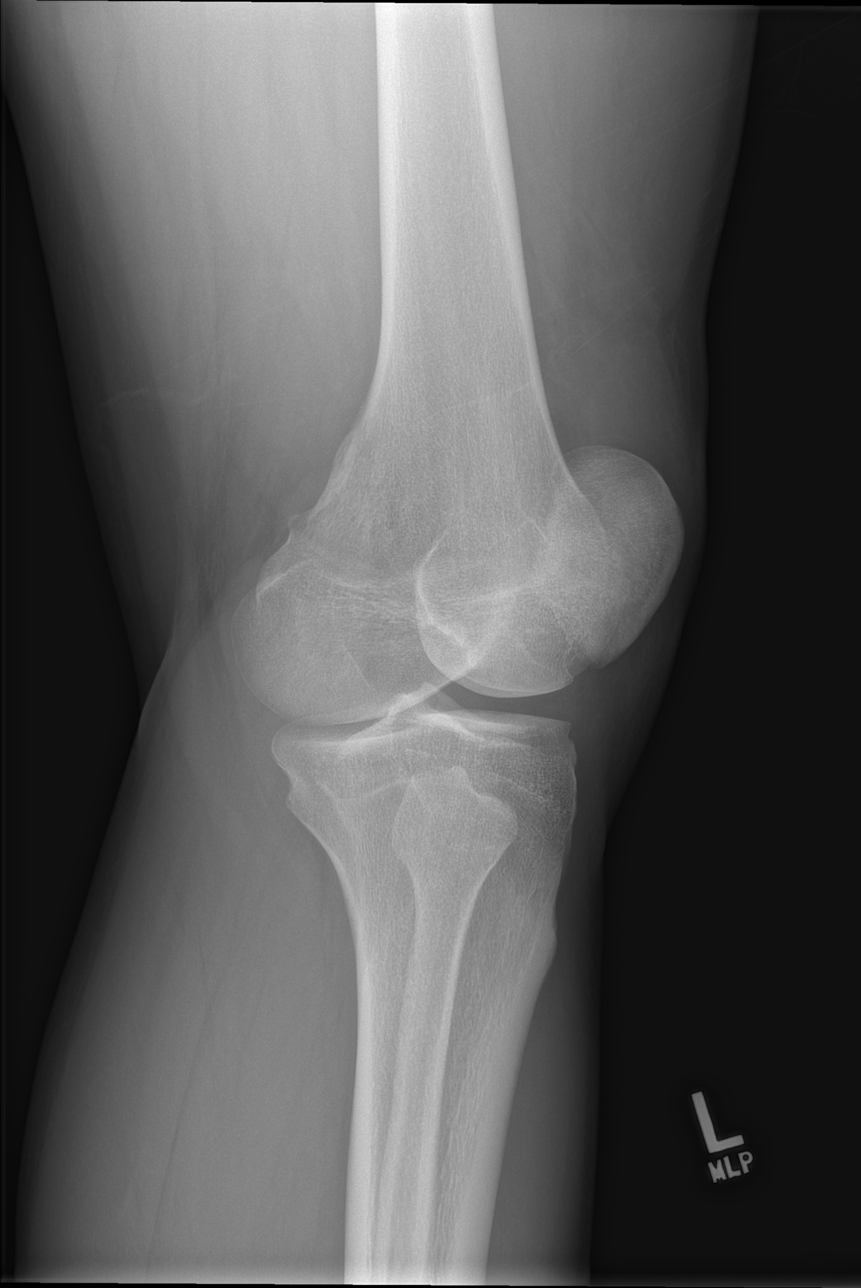

[t knee lat left]
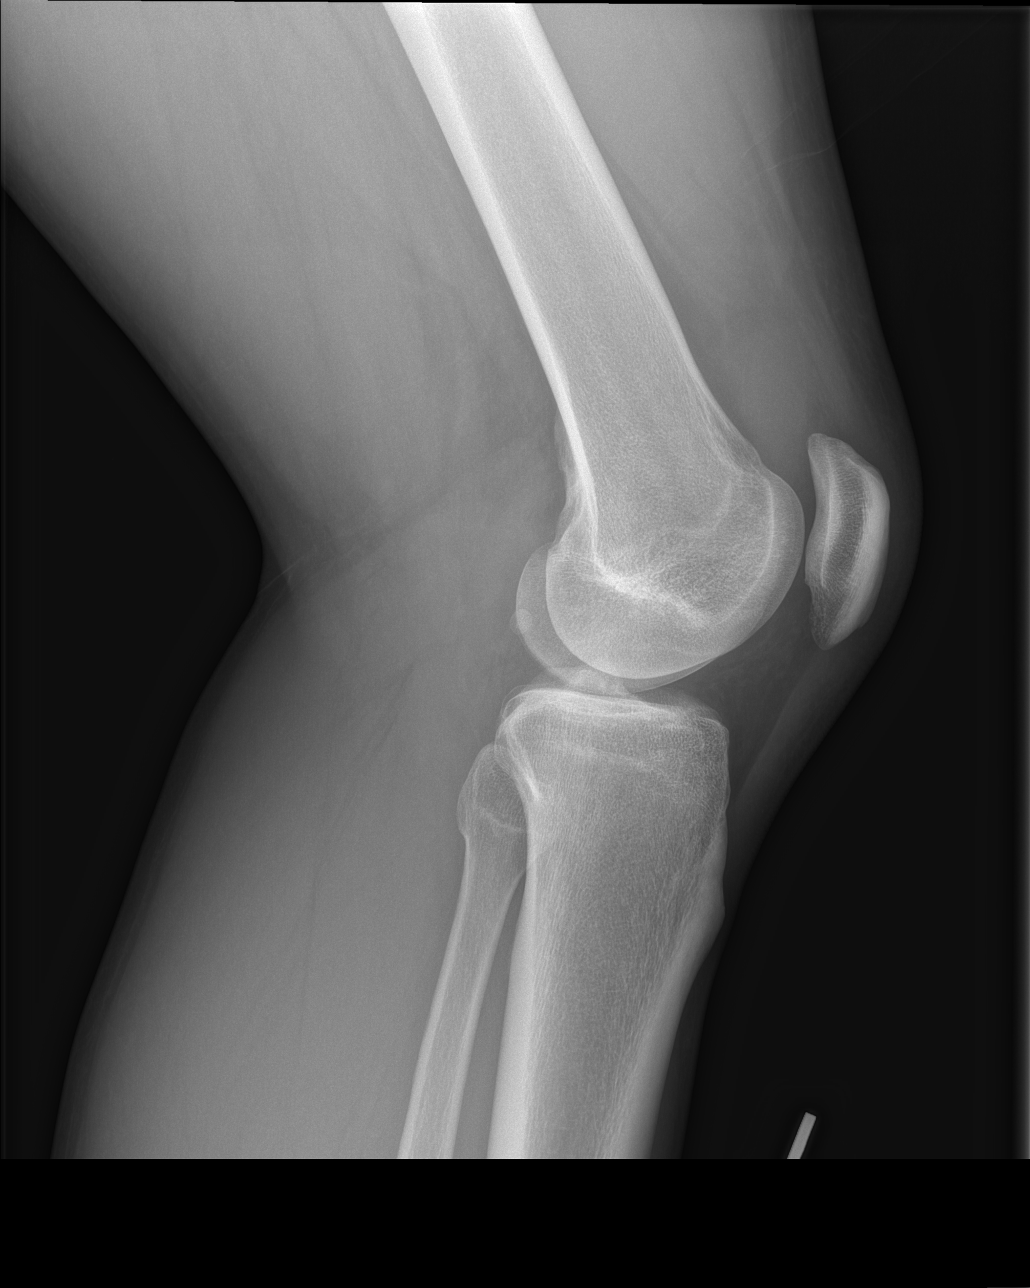

[4 of 4 positions shown; findings below may reference images not displayed]

FINDINGS: Negative for acute fracture or dislocation. There is no radiopaque
foreign body. There is a small knee joint effusion.

No significant arthritic changes are evident. There is no bone
lesion or bony destruction.
IMPRESSION: Small joint effusion.  No acute bony abnormality.

## 2019-05-25 ENCOUNTER — Other Ambulatory Visit: Payer: Self-pay

## 2019-05-25 ENCOUNTER — Emergency Department (HOSPITAL_COMMUNITY)
Admission: EM | Admit: 2019-05-25 | Discharge: 2019-05-26 | Disposition: A | Discharge status: Home / Self Care | Payer: Self-pay | Attending: Emergency Medicine | Admitting: Emergency Medicine

## 2019-05-25 ENCOUNTER — Encounter (HOSPITAL_COMMUNITY): Payer: Self-pay | Admitting: Emergency Medicine

## 2019-05-25 DIAGNOSIS — M109 Gout, unspecified: Secondary | ICD-10-CM | POA: Insufficient documentation

## 2019-05-25 DIAGNOSIS — F17200 Nicotine dependence, unspecified, uncomplicated: Secondary | ICD-10-CM | POA: Insufficient documentation

## 2019-05-25 DIAGNOSIS — M25561 Pain in right knee: Secondary | ICD-10-CM | POA: Insufficient documentation

## 2019-05-25 HISTORY — DX: Gout, unspecified: M10.9

## 2019-05-25 NOTE — ED Triage Notes (Signed)
Patient presents with chronic knee pain. He has a history of gout. Patient reports this flair up started a couple of days ago but got worse today after a 5 hour drive. For pain relief the patient has taken Prednisone, Ibuprofen and Colchicine.

## 2019-05-26 MED ORDER — ALLOPURINOL 100 MG PO TABS: 100.0000 mg | ORAL_TABLET | Freq: Every day | ORAL | 2 refills | Status: AC

## 2019-05-26 MED ORDER — COLCHICINE 0.6 MG PO TABS
0.6000 mg | ORAL_TABLET | Freq: Once | ORAL | Status: AC
  Administered 2019-05-26: 0.6 mg via ORAL
  Filled 2019-05-26: qty 1

## 2019-05-26 MED ORDER — COLCHICINE 0.6 MG PO TABS: 0.6000 mg | ORAL_TABLET | Freq: Every day | ORAL | 0 refills | Status: AC

## 2019-05-26 MED ORDER — METHYLPREDNISOLONE 4 MG PO TBPK: ORAL_TABLET | ORAL | 0 refills | Status: AC

## 2019-05-26 MED ORDER — METHYLPREDNISOLONE SODIUM SUCC 125 MG IJ SOLR
125.0000 mg | Freq: Once | INTRAMUSCULAR | Status: AC
  Administered 2019-05-26: 01:00:00 125 mg via INTRAMUSCULAR
  Filled 2019-05-26: qty 2

## 2019-05-26 NOTE — ED Provider Notes (Signed)
Emergency Department Provider Note   I have reviewed the triage vital signs and the nursing notes.   HISTORY  Chief Complaint Knee Pain   HPI Derrick Shaffer is a 39 y.o. male with reported history of gout the presents emerged from for the gout flare of his right knee.  Patient states he drank some alcohol over the last couple days which he notices a precipitating factor.  He then drove back today and progressively worsening pain in his right knee where he had gout in the past.  No recent fevers infections or other illnesses.   No other associated or modifying symptoms.    Past Medical History:  Diagnosis Date  . Gout     There are no problems to display for this patient.   Past Surgical History:  Procedure Laterality Date  . HERNIA REPAIR      Current Outpatient Rx  . Order #: [id number] Class: Print  . Order #: [id number] Class: Print  . Order #: [id number] Class: Normal  . Order #: [id number] Class: Print  . Order #: [id number] Class: Print  . Order #: [id number] Class: Normal  . Order #: [id number] Class: Print  . Order #: [id number] Class: Normal    Allergies Patient has no known allergies.  History reviewed. No pertinent family history.  Social History Social History   Tobacco Use  . Smoking status: Current Some Day Smoker  . Smokeless tobacco: Never Used  Substance Use Topics  . Alcohol use: Yes    Comment: occassionally  . Drug use: No    Review of Systems  All other systems negative except as documented in the HPI. All pertinent positives and negatives as reviewed in the HPI. ____________________________________________   PHYSICAL EXAM:  VITAL SIGNS: ED Triage Vitals  Enc Vitals Group     BP 05/25/19 2024 (!) 140/101     Pulse Rate 05/25/19 2024 72     Resp 05/25/19 2024 16     Temp 05/25/19 2024 98.1 F (36.7 C)     Temp Source 05/25/19 2024 Oral     SpO2 05/25/19 2024 100 %     Weight 05/25/19 2048 245 lb (111.1 kg)     Height 05/25/19  2048 6' (1.829 m)     Head Circumference --      Peak Flow --      Pain Score 05/25/19 2048 10     Pain Loc --      Pain Edu? --      Excl. in [place]? --     Constitutional: Alert and oriented. Well appearing and in no acute distress. Eyes: Conjunctivae are normal. PERRL. EOMI. Head: Atraumatic. Nose: No congestion/rhinnorhea. Mouth/Throat: Mucous membranes are moist.  Oropharynx non-erythematous. Neck: No stridor.  No meningeal signs.   Cardiovascular: Normal rate, regular rhythm. Good peripheral circulation. Grossly normal heart sounds.   Respiratory: Normal respiratory effort.  No retractions. Lungs CTAB. Gastrointestinal: Soft and nontender. No distention.  Musculoskeletal: Right knee slightly warm to touch but no significant edema, erythema. Neurologic:  Normal speech and language. No gross focal neurologic deficits are appreciated.  Skin:  Skin is warm, dry and intact. No rash noted.   ____________________________________________  INITIAL IMPRESSION / ASSESSMENT AND PLAN / ED COURSE  Recurrent gout attack we will treat for gout flare.  No trauma to suggest traumatic injury or need for images.  Also does not have any infectious symptoms to make me concerned for septic arthritis.  Will treat appropriately and start allopurinol in this gout attack is  over with referral to a PCP.     Pertinent labs & imaging results that were available during my care of the patient were reviewed by me and considered in my medical decision making (see chart for details).  A medical screening exam was performed and I feel the patient has had an appropriate workup for their chief complaint at this time and likelihood of emergent condition existing is low. They have been counseled on decision, discharge, follow up and which symptoms necessitate immediate return to the emergency department. They or their family verbally stated understanding and agreement with plan and discharged in stable condition.     ____________________________________________  FINAL CLINICAL IMPRESSION(S) / ED DIAGNOSES  Final diagnoses:  Acute pain of right knee     MEDICATIONS GIVEN DURING THIS VISIT:  Medications  methylPREDNISolone sodium succinate (SOLU-MEDROL) 125 mg/2 mL injection 125 mg (125 mg Intramuscular Given 05/26/19 0102)  colchicine tablet 0.6 mg (0.6 mg Oral Given 05/26/19 0105)     NEW OUTPATIENT MEDICATIONS STARTED DURING THIS VISIT:  Discharge Medication List as of 05/26/2019 12:45 AM    START taking these medications   Details  allopurinol (ZYLOPRIM) 100 MG tablet Take 1 tablet (100 mg total) by mouth daily. Start after you are done with steroids, Starting Sat 05/26/2019, Print    colchicine 0.6 MG tablet Take 1 tablet (0.6 mg total) by mouth daily., Starting Sat 05/26/2019, Print    methylPREDNISolone (MEDROL DOSEPAK) 4 MG TBPK tablet Take per instructions on box, Print        Note:  This note was prepared with assistance of Dragon voice recognition software. Occasional wrong-word or sound-a-like substitutions may have occurred due to the inherent limitations of voice recognition software.   Miguel Christiana, Corene Cornea, MD 05/26/19 [phone]
